# Patient Record
Sex: Female | Born: 1940 | Race: Black or African American | Hispanic: No | Smoking: Current every day smoker
Health system: Southern US, Community
[De-identification: ages and names within clinical notes are randomized; demographics above are authoritative.]

## PROBLEM LIST (undated history)

## (undated) DIAGNOSIS — M1990 Unspecified osteoarthritis, unspecified site: Secondary | ICD-10-CM

## (undated) DIAGNOSIS — I10 Essential (primary) hypertension: Secondary | ICD-10-CM

## (undated) HISTORY — PX: TOTAL HIP ARTHROPLASTY: SHX124

---

## 2005-04-02 ENCOUNTER — Other Ambulatory Visit: Admission: RE | Admit: 2005-04-02 | Discharge: 2005-04-02 | Payer: Self-pay | Admitting: Family Medicine

## 2005-04-25 ENCOUNTER — Inpatient Hospital Stay (HOSPITAL_COMMUNITY): Admission: RE | Admit: 2005-04-25 | Discharge: 2005-04-28 | Payer: Self-pay | Admitting: Orthopedic Surgery

## 2005-04-25 ENCOUNTER — Ambulatory Visit: Payer: Self-pay | Admitting: Physical Medicine & Rehabilitation

## 2005-04-28 ENCOUNTER — Inpatient Hospital Stay
Admission: RE | Admit: 2005-04-28 | Discharge: 2005-05-04 | Payer: Self-pay | Admitting: Physical Medicine & Rehabilitation

## 2005-08-08 ENCOUNTER — Ambulatory Visit (HOSPITAL_BASED_OUTPATIENT_CLINIC_OR_DEPARTMENT_OTHER): Admission: RE | Admit: 2005-08-08 | Discharge: 2005-08-08 | Payer: Self-pay | Admitting: Orthopedic Surgery

## 2005-08-08 ENCOUNTER — Ambulatory Visit (HOSPITAL_COMMUNITY): Admission: RE | Admit: 2005-08-08 | Discharge: 2005-08-08 | Payer: Self-pay | Admitting: Orthopedic Surgery

## 2007-09-03 ENCOUNTER — Inpatient Hospital Stay (HOSPITAL_COMMUNITY): Admission: RE | Admit: 2007-09-03 | Discharge: 2007-09-07 | Payer: Self-pay | Admitting: Orthopedic Surgery

## 2009-10-28 ENCOUNTER — Ambulatory Visit (HOSPITAL_COMMUNITY): Admission: RE | Admit: 2009-10-28 | Discharge: 2009-10-28 | Payer: Self-pay | Admitting: Orthopedic Surgery

## 2009-11-08 ENCOUNTER — Ambulatory Visit (HOSPITAL_COMMUNITY): Admission: RE | Admit: 2009-11-08 | Discharge: 2009-11-08 | Payer: Self-pay | Admitting: Orthopedic Surgery

## 2011-05-08 NOTE — Op Note (Signed)
NAMEGLORYA, Kara Montoya                ACCOUNT NO.:  [id number]   MEDICAL RECORD NO.:  [id number]          PATIENT TYPE:  INP   LOCATION:  2899                         FACILITY:  [facility]   PHYSICIAN:  Harvie Junior, M.D.   DATE OF BIRTH:  28-Jun-1941   DATE OF PROCEDURE:  09/03/2007  DATE OF DISCHARGE:                               OPERATIVE REPORT   PREOPERATIVE DIAGNOSES:  1. End stage degenerative joint disease, left hip.  2. Degenerative joint disease, right knee.   POSTOPERATIVE DIAGNOSES:  1. End stage degenerative joint disease, left hip.  2. Degenerative joint disease, right knee.   PRINCIPAL PROCEDURES:  1. Left total hip replacement with Laural Benes & [facility] S-ROM system, a      2015 stem, 3620D large cone, 36+8 stem with a 46+0 ball to go with      an ASR 52-mm cup.  2. Injection of right knee with 2 mL of 80 mg per mL Depo-Medrol.   SURGEON:  Harvie Junior, M.D.   ASSISTANT:  Marshia Ly, P.A.   ANESTHESIA:  General.   BRIEF HISTORY:  Ms. Loudin is a 70 year old female with a long history  of having end stage degenerative joint disease of bilateral hips.  We  have done her right hip.  She did great.  Because of fair conservative  care to her left hip, she came for this procedure.  She also has  significant degenerative joint disease of the right knee and we talked  about the need for an injection and she wanted to do this while she was  asleep and we planned on doing this preoperatively.   PROCEDURE:  The patient was brought to the operating room.  After  adequate anesthesia was obtained with general anesthetic, the patient  was placed up on the operating table.  The left knee was then injected  with 10 mL of 0.25% Marcaine with 2 mL of 80 mg per mL of Depo-Medrol.  This was done under sterile condition and a Band-Aid was placed.   She was then moved onto the right lateral decubitus position for the  left hip.  All bony prominences were well padded and a Foley  catheter  was then placed.  At this point, the left hip was prepped and draped in  the usual sterile fashion.  Following this, a curved incision was made  for a posterior approach to the hip.  Subcutaneous tissue was taken down  to the level of the tensor fascia which was divided in line with its  fibers and the gluteus medius and gluteus maximus with fluffs was finger  fractured.  A trial retractor was put in place.  The hip was then  internally rotated and couple retractors were placed superior to the  piriformis and also in the capsule.  The capsule was taken down  posteriorly in a flap.  The hip was dislocated and a provision neck cut  made.  The pattern was then exposed.  __________  was performed  circumferentially reaming to a level of 51 and a 52-mm ASR cup was  placed and  excellent left lower fixation laterally with an offset were  achieved.   At this point, attention was turned towards the stem where the old canal  finder was used, followed by sequential reaming up to a level of 15 and  select bone within the 15 reamer and a 15 __________  reamer was placed  down 3/4 of the way.  Following this, the cone was trialed to a 20D cone  and then a large __________  was reamed.  Following this, the 20D trial  was placed, 2015 stem and 36+8 __________  and trial reduction with  excellent range of motion and stability was achieved distally.  The  trial components were removed.  We copiously and thoroughly irrigated  the hip at this point with pulsatile lavage irrigation given that she  had a little bit of heterotopic bone on the other side.  Following this,  the final components were placed, a 20D large cone and spout followed by  a 2015 stem with a 36+8 offset.  This again was trialed after this was  put all the way down with a +0 ball.  Excellent range of motion and  stability were achieved and so this was the final chosen +0 ball at this  point.  The two external rotators and  posterior capsule were then  repaired over drill holes after one final irrigation and following this,  the tensor fascia was closed with 1 Vicryl running suture, skin with 0  and 2-0 Vicryl and skin staples.  A sterile compressive dressing was  applied and knee immobilizer.  The patient was taken to recovery, was  noted to be in satisfactory condition.   Estimated blood loss of the procedure was 250 mL.      Harvie Junior, M.D.  Electronically Signed     JLG/MEDQ  D:  09/03/2007  T:  09/03/2007  Job:  [id number]

## 2011-05-11 NOTE — Discharge Summary (Signed)
Kara Montoya, Kara Montoya                ACCOUNT NO.:  [id number]   MEDICAL RECORD NO.:  [id number]          PATIENT TYPE:  INP   LOCATION:  5012                         FACILITY:  [facility]   PHYSICIAN:  Harvie Junior, M.D.   DATE OF BIRTH:  05/11/1941   DATE OF ADMISSION:  04/25/2005  DATE OF DISCHARGE:  04/28/2005                                 DISCHARGE SUMMARY   ADMISSION DIAGNOSIS:  1.  Degenerative joint disease, right hip.  2.  Hypertension.  3.  Osteoarthritis.  4.  Tobacco abuse.   PROCEDURE:  Right total hip arthroplasty, Jodi Geralds, M.D., Apr 25, 2005.   CONSULTATIONS:  Physical medicine and rehabilitation, Ellwood Dense, M.D.   BRIEF HISTORY:  Kara Montoya is a pleasant 70 year old female who has a long  history of right hip pain.  She has pain in the right groin, she has pain at  night, and pain with ambulation.  X-rays of her right hip and pelvis show  end stage degenerative joint disease of the right hip.  She has stiffness of  her hip with pain with motion and difficulty with activities of daily living  due to her right hip osteoarthritis.  Based upon her clinical and  radiographic findings, she was felt to be a candidate for a right total hip  arthroplasty and she is admitted for this.   LABORATORY DATA:  Postoperative x-rays of the pelvis and right hip showed  acetabular and proximal femoral component seen.  The x-ray showed no  evidence of complications with good position of her prosthesis.  Hemoglobin  on admission was 14.8 with hematocrit 44.8.  Her potassium was 4.2.  CMP was  within normal limits.  Her INR was 1.  On postoperative day one, her  hemoglobin was 11.6 with  hematocrit 34, INR 1.1, potassium 4.6, sodium 140,  glucose 143.  On postop day two, the patient's hemoglobin was 11.3 with  hematocrit 33, INR 1.4.  On the day of discharge, her hemoglobin was 10.3  with an INR 1.5 on Coumadin therapy.   HOSPITAL COURSE:  The patient was taken to the operating  room and underwent  right total hip arthroplasty with an S-ROM prosthesis per Dr. Luiz Blare'  operative note.  Postoperatively, she was put on Coumadin per pharmacy  protocol for DVT prophylaxis, PCA morphine pump and IV 1 gram q.8h. x 5  doses.  Physical therapy saw the patient on postop day one and got her out  of bed to the chair.  On postop day one, she is overall doing well, she had  no complaints.  She had moderate hip pain.  Her blood pressure medication  was held as her blood pressure was 96/56.  She was seen by Dr. Thomasena Edis for a  rehab consult and was felt to be an excellent candidate for subacute care  unit transfer.  On postop day two, she was doing well, she stated she was  ready to get out of bed, she was taking fluids without difficulty, a Foley  catheter was placed at the time of surgery and was discontinued.  Her input  and output was good with good urine production.  She had a low grade fever  of 99.7, hemoglobin 11.3, INR 1.4.  Postoperative x-rays showed good  position of the right total hip prosthesis.  On postop day two, her IV was  converted to a saline lock, her Foley catheter was discontinued, and her PCA  morphine pump was discontinued.  On postop day three, the patient was doing  well, she was working with physical therapy and was felt to be an excellent  candidate for SACU transfer.  She was discharged to the subacute care unit  on postop day three.  Her INR was 1.5 at this point with a hemoglobin of  10.3.  Her right hip incision was clean and dry without signs of infection  and she had good neurovascular status in the right lower extremity with a  soft, nontender calf.   CONDITION ON DISCHARGE:  Improved.   DISCHARGE INSTRUCTIONS:  Diet regular.  Activity status will be weight-  bearing as tolerated on the right, ambulating with a walker.  We will see  her every few days while on the subacute floor.   DISCHARGE MEDICATIONS:  Her usual home medications, Atenolol  one daily with  the addition of Coumadin per pharmacy protocol and Percocet 5 mg 1-2 q.6h.  p.r.n. pain.      Marshia Ly, P.A.      Harvie Junior, M.D.  Electronically Signed    JB/MEDQ  D:  07/18/2005  T:  07/18/2005  Job:  [id number]

## 2011-05-11 NOTE — Discharge Summary (Signed)
Kara Montoya, Kara Montoya NO.:  [id number]   MEDICAL RECORD NO.:  [id number]          PATIENT TYPE:  ORB   LOCATION:  4530                         FACILITY:  [facility]   PHYSICIAN:  Ellwood Dense, M.D.   DATE OF BIRTH:  Feb 27, 1941   DATE OF ADMISSION:  04/28/2005  DATE OF DISCHARGE:  05/04/2005                                 DISCHARGE SUMMARY   DISCHARGE DIAGNOSES:  1.  Right total hip replacement.  2.  Postoperative anemia.  3.  Hypertension.   HISTORY OF PRESENT ILLNESS:  Kara Montoya is a 70 year old female with a  history of hypertension end-stage DJD of the right hip with pain and  decrease in ambulation.  She elected to undergo right total hip replacement  on Apr 25, 2005, by Dr. Luiz Blare.  Postoperatively is weightbearing as  tolerated and on Coumadin for DVT prophylaxis.  Therapy was initiated and  the patient is at minimal assistance for transfers, minimal assistance at  supervision to ambulate 10 feet, requiring verbal cues and is noted to have  a slow rate.  Pain control continues to be an issue with problems with lower  extremity spasms.  SACU was consulted for progressive therapies.   PAST MEDICAL HISTORY:  Significant for hypertension and obesity.   ALLERGIES:  No known drug allergies.   FAMILY HISTORY:  Positive for CVA.   SOCIAL HISTORY:  The patient lives alone.  Was working until March of 2006.  She smokes five to six cigarettes a day.  Does not use any alcohol.   HOSPITAL COURSE:  Kara Montoya was admitted to subacute on Apr 28, 2005,  for SACU level therapies to consist of PT and OT daily.  Post admission, she  was maintained on Coumadin for DVT prophylaxis.  She is noted to have right  lower extremity edema and problems with right calf and shin pain.  Bilateral  lower extremity Dopplers were done on Apr 30, 2005, showing no obvious DVT or  SVT.  Study was limited secondary to body habitus and edema.  Followup  laboratories done on Apr 30, 2005, showed the patient's postoperative anemia  to be stable with hemoglobin 10.3, hematocrit 29.7, white count 7.0 and  platelets 235.  Check of electrolytes revealed sodium 141, potassium 3.9,  chloride 106, CO2 29, BUN 11, creatinine 1.0 and glucose 121.  [name]  right hip incision was noted to be healing well without any signs or  symptoms of injection.  No erythema noted.  The patient was started on  OxyContin CR for more consistent pain relief with Flexeril for muscle  spasms.  The patient made good progress during her stay on subacute.  At the  time of discharge, she was modified independent for transfers and modified  independent for ambulating 200 feet with a rolling walker.  She was at  modified independent level for ADLs and modified independent level for  toileting.  Further followup therapies to include home health PT and OT by  [facility].  On May 04, 2005, the patient is discharged to home.  DISCHARGE MEDICATIONS:  1.  Coumadin 5 mg two p.o. q.p.m.  2.  Trinsicon one p.o. b.i.d.  3.  Tenormin 100 mg a day.  4.  OxyContin CR 10 mg one p.o. b.i.d. x 1 week and then one per day until      gone.  5.  Flexeril 5 mg t.i.d. p.r.n. spasm.  6.  Senokot-S two p.o. q.h.s.   ACTIVITY:  Use walker.  Follow right total hip precautions.   DIET:  Regular.   WOUND CARE:  Wash with soap and water.  Keep area clean and dry.   SPECIAL INSTRUCTIONS:  No alcohol.  No smoking.  No driving.  May use  Tylenol additionally for pain.  Dry dressing to right hip until drainage  resolves.  [facility] to provide PT, OT and R.N. with next pro time  to be drawn on May 07, 2005.  Coumadin to continue until May 26, 2005.   FOLLOWUP:  The patient is to follow up with Dr. Luiz Blare for an appointment in  two weeks.  Follow up with Dr. Idell Pickles for routine check.  Follow up with  Ellwood Dense, M.D., as needed.      PP/MEDQ  D:  05/04/2005  T:  05/06/2005  Job:  [id number]   cc:    Harvie Junior, M.D.  [address]  [place]  [place] [address]  Fax: [phone]   Philis Nettle, MD

## 2011-05-11 NOTE — Discharge Summary (Signed)
Kara Montoya, [name]                ACCOUNT NO.:  [id number]   MEDICAL RECORD NO.:  [id number]          PATIENT TYPE:  INP   LOCATION:  5018                         FACILITY:  [facility]   PHYSICIAN:  Harvie Junior, M.D.   DATE OF BIRTH:  Jul 11, 1941   DATE OF ADMISSION:  09/03/2007  DATE OF DISCHARGE:  09/07/2007                               DISCHARGE SUMMARY   ADMISSION DIAGNOSES:  1. End-stage degenerative joint disease, left hip.  2. Moderate hypertension.  3. Status post right total hip arthroplasty in 2006.  4. Degenerative arthritis, right knee.   DISCHARGE DIAGNOSES:  1. End-stage degenerative joint disease, left hip.  2. Moderate hypertension.  3. Status post right total hip arthroplasty in 2006.  4. Degenerative arthritis, right knee.  5. Postoperative nausea and vomiting.  6. Morbid obesity.   PROCEDURES IN THE HOSPITAL:  1. Left total hip arthroplasty using a SROM prosthesis,  Jodi Geralds,      MD, September 03, 2007.  2. Intra-articular cortisone injection, right knee.   BRIEF HISTORY:  Kara Montoya is a 70 year old female well known to [place]  who has a long his history of left groin and hip pain and stiffness with  limited range of motion.  X-rays of her pelvis and left hip showed end-  stage bone-on-bone degenerative arthritis.  She got no relief with  conservative treatment had pain with ambulation and pain at night. Based  upon her clinical and radiographic findings, she was admitted for a left  total hip arthroplasty.  She had the right one done by Dr. Luiz Blare in  2006 and did well from it and is interested in pursuing the left side.  She also has degenerative arthritis noted clinically and on radiographs  of the right knee,  and at the time of her hip surgery will get a  cortisone injection into her right knee joint.  She is admitted for  these procedures.   PERTINENT LABORATORY STUDIES:  Her EKG on admission showed normal sinus  rhythm with nonspecific T-wave  abnormality.  when compared with EKG of  August 07, 2005, ST changes were more marked.   Postoperative x-ray of the pelvis and left hip showed a left total hip  arthroplasty anatomically in alignment.   Hemoglobin on admission was 15.0, hematocrit 45.1.  On postop day #1,  hemoglobin was 12.1, day #2 was 11.5; and #3 was 10.8.  Pro time on  admission was 13.0 seconds with an INR of 1.0.  PTT was 28.  On the day  of discharge, her INR was 1.6 with a pro time of 19.1 seconds.  CMET on  admission showed no abnormalities other than slightly elevated glucose  at 119. She did have an elevated alkaline phosphatase of 136 with normal  being 39-117.  No other abnormalities noted.  Urinalysis showed no  abnormalities.   HOSPITAL COURSE:  The patient was brought to the hospital where she  underwent left total hip arthroplasty as well described in Dr. Luiz Blare'  operative note of September 03, 2007. Preoperatively, she was given a  gram of Ancef  and 80 mg IV of gentamicin.  She was given 1 gram Ancef  q.8 h x3 doses postop. PCA Dilaudid pump was used for pain control.  Postoperatively, she was on IV fluids.  Physical therapy was ordered for  walker ambulation, weightbearing as tolerated on the left.  Postoperative x-ray showed good position and alignment of the  prosthesis.   On postop day #1, she had complaints of being nauseated. Her Foley  catheter placed at time of surgery was intact.  She was taking p.o.  fluids without difficulty.  Her vital signs were stable.  She was  afebrile.  Hemoglobin was 12.1.  Her INR was 1.1.  We felt that the  nausea may be secondary to her IV Dilaudid, and this was discontinued.  She was started on OxyContin 30 mg b.i.d. and used Percocet for  breakthrough pain.  She was on subcutaneous Lovenox, converting over to  Coumadin for DVT prophylaxis.   On postop day #2, she stated she felt pretty good. She was taking fluids  and voiding without difficulty.  She got  out of bed to the chair.  Her  left hip dressing was clean and dry.  Hemoglobin was 11.5.  INR was 1.2.  Her knee immobilizer was discontinued, and her IV was converted to a  saline lock.   On postop day #3, she was doing well.  She was taking fluids and voiding  without difficulty.  She was progressing with physical therapy and was  independently ambulating with a walker.  Vital signs were stable.  She  was afebrile.  Her left hip dressing was clean and dry.  Her wound was  benign.  Her INR was 1.5.  Her hemoglobin was 10.8. The saline lock was  discontinued.  She was discharged home after being seen by physical  therapy.  She was given a prescription for Percocet 5 mg p.o. for pain  and Coumadin x1 month postop for DVT prophylaxis.   She was discharged to ambulate weightbearing as tolerated on the left  with a walker.  She will get her dressing changed to the left hip every  3 days.  She will need home health physical therapy and home health  Coumadin management x1 month postop.   She will follow up with Dr. Luiz Blare in the office in 10 days.  She will  be on a regular diet at time of discharge.      Kara Montoya, P.A.      Harvie Junior, M.D.  Electronically Signed    JB/MEDQ  D:  12/29/2007  T:  12/29/2007  Job:  [id number]   cc:   Sigmund Hazel, M.D.

## 2011-05-11 NOTE — Op Note (Signed)
NAME:  Kara Montoya, Kara Montoya                ACCOUNT NO.:  [id number]   MEDICAL RECORD NO.:  [id number]          PATIENT TYPE:  AMB   LOCATION:  DSC                          FACILITY:  [facility]   PHYSICIAN:  Harvie Junior, M.D.   DATE OF BIRTH:  Jul 06, 1941   DATE OF PROCEDURE:  08/08/2005  DATE OF DISCHARGE:                                 OPERATIVE REPORT   PREOPERATIVE DIAGNOSIS:  Painful right knee.   POSTOPERATIVE DIAGNOSES:  1.  Lateral meniscal tear.  2.  Chondromalacia, medial femoral condyle.   PROCEDURE:  1.  Partial posterior and lateral meniscectomy.  2.  Debridement of chondromalacia, medial femoral condyle.   SURGEON:  Harvie Junior, M.D.   ASSISTANT:  Marshia Ly, P.A.   ANESTHESIA:  General.   INDICATIONS FOR PROCEDURE:  [name] is a 70 year old female with a long  history of having significant right knee pain.  She has had a total hip done  three months ago, and was continuing to persist with right knee pain, so  this was really what was slowing her up.  Examination showed that she had a  medial joint line tenderness and pain with provocative maneuvers.  It was  ultimately felt that an arthroscopy would be the most appropriate course of  action, given that the knee was the rate-limiting factor. She is taken to  the operating room for this procedure.   DESCRIPTION OF PROCEDURE:  The patient is taken to the operating room.  After an adequate level of anesthesia was obtained with general anesthesia,  the patient was placed on the operating room table.  The right leg was  prepped and draped in the usual sterile fashion.  Following this a routine  arthroscopic examination of the knee revealed that there was an obvious  posterior horn lateral meniscal tear, which was debrided with the straight  biting forceps.  The remaining meniscal rim was contoured down with the  flexion shaver and a probe was used to make sure it was stable.  On the  medial side interestingly  posteromedially, I do not think there was a V-  style tear, but I think that this is where the meniscus came to an end.  We  debrided that.  The meniscus was certainly stable in that area.  It  certainly was not pulling into the knee at all.  We debrided back and sort  of blunted off this edge, so it would not be catching, and the medial  femoral condyle showed a little bit of an area of chondromalacia up in the  area where a medial plica may be.  We debrided the medial plica out as well,  as debriding the medial femoral condyle and following this the knee was  copiously irrigated and suctioned dry until the femoral joint was debrided  thoroughly.  There was no really significant chondromalacia of the  patellofemoral joint.  At this point the knee was again copiously irrigated  and suctioned dry.  The arthroscope portal was closed with a bandage.  A  sterile compressive dressing was applied.   The  patient was taken to the recovery room.  She was noted to be in  satisfactory condition.  The estimated blood loss for the procedure was  none.      Harvie Junior, M.D.  Electronically Signed     JLG/MEDQ  D:  08/08/2005  T:  08/08/2005  Job:  [id number]

## 2011-05-11 NOTE — Op Note (Signed)
NAME:  Kara Montoya, Kara Montoya                ACCOUNT NO.:  [id number]   MEDICAL RECORD NO.:  [id number]          PATIENT TYPE:  INP   LOCATION:  5012                         FACILITY:  [facility]   PHYSICIAN:  Harvie Junior, M.D.   DATE OF BIRTH:  08-06-1941   DATE OF PROCEDURE:  04/25/2005  DATE OF DISCHARGE:                                 OPERATIVE REPORT   PREOPERATIVE DIAGNOSIS:  End stage degenerative joint disease, right hip.   POSTOPERATIVE DIAGNOSIS:  End stage degenerative joint disease, right hip.   PROCEDURE:  Right total hip replacement with Laural Benes and Laural Benes S-ROM  prosthesis, a 20/15 stem with a 36 plus 0 neck and ball, 32 mm, 20D large  cone, a 54 mm socket with a 10 degree hooded polyethylene liner.   SURGEON:  Harvie Junior, M.D.   ASSISTANT:  Marshia Ly, P.A.   ANESTHESIA:  General.   BRIEF HISTORY:  She is a 70 year old female with a long history of having  significant end stage degenerative joint disease of her right hip.  She was  also evaluated in the office and noted to have this.  We discussed treatment  options and it was ultimately felt that total hip replacement was the most  appropriate course of action.  She considered her options carefully and  ultimately did present requesting total hip replacement, right side.  She is  brought to the operating room for this procedure.   DESCRIPTION OF PROCEDURE:  The patient was brought to the operating room and  after adequate anesthesia was obtained with general anesthetic, the patient  was placed on the operating table.  The right hip was prepped and draped in  a sterile fashion after the patient was rolled in the left lateral decubitus  position, all bony prominences had been well padded.  Attention was turned  to the lateral aspect of the hip where a long incision was made.  The  subcutaneous tissue were dissected down to the tensor fascia which was  divided in line with its fibers and a posterior approach to the  hip was  undertaken.  The hip was dislocated, provisional neck cut made, and  attention was turned to the acetabulum.   We sequentially reamed up to a level of 53, a 54 socket placed in 45 degrees  of lateral opening and 30 degrees of anteversion.  A polyethylene trial  liner was then used and attention was turned to the stem, sequentially  reamed.  We initially templated to 18/13 but it was clear that was going to  be a larger stem, we reamed her up to 15, I got good chatter there, and did  15/5 3/4 of the way down.  We turned to the stem then and did a 20D large  cone.  At this point, the trial was put in place with a 42 base neck as a  template, it was too long, offset was too much.  We changed this to a 36  base neck and this felt great, stability was excellent.  We did antevert 10  degrees to improve stability  and excellent stability was achieved at this  point.  At this point, the wound was copiously irrigated and suctioned dry.  The trial components were removed.  The apex hole eliminator was placed.  The final polyethylene was placed, 54 degree 10 degree liner in the  posterior superior position.  Attention was turned to the stem, a 20/15 stem  was placed through a 20D large cone and a 36 stem with a 32 mm ball.  At  this point, the hip was put through a range of motion and excellent  stability was achieved as well as symmetric leg length and good extension in  external rotation, we could flex her to 90 with internal rotation of about  60 degrees with no tendency towards dislocation.   At this point, the wound was copiously irrigated and suctioned dry.  The  capsule and short external rotators were repaired posteriorly through drill  holes in the trochanter.  The tensor fascia was closed with #1 Vicryl  running suture, the subcu with 0 and 2-0 Vicryl and skin with skin staples.  A sterile compressive dressing was applied as well as a knee immobilizer.  The patient was taken to  the recovery room where she was noted to be in  satisfactory condition.  Estimated blood loss was none.      JLG/MEDQ  D:  04/25/2005  T:  04/25/2005  Job:  [id number]

## 2011-10-05 LAB — PROTIME-INR
INR: 1.1
INR: 1.2
Prothrombin Time: 13
Prothrombin Time: 14.7
Prothrombin Time: 19.1 — ABNORMAL HIGH

## 2011-10-05 LAB — URINALYSIS, ROUTINE W REFLEX MICROSCOPIC
Bilirubin Urine: NEGATIVE
Ketones, ur: NEGATIVE
Protein, ur: NEGATIVE
Urobilinogen, UA: 0.2

## 2011-10-05 LAB — CBC
HCT: 31.5 — ABNORMAL LOW
HCT: 36.3
HCT: 45.1
Hemoglobin: 10.8 — ABNORMAL LOW
Hemoglobin: 12.1
Hemoglobin: 15
MCHC: 33.2
MCV: 90.8
MCV: 91.4
MCV: 91.6
MCV: 92.6
Platelets: 178
Platelets: 181
Platelets: 229
RBC: 3.47 — ABNORMAL LOW
RBC: 3.71 — ABNORMAL LOW
RBC: 3.93
RBC: 4.92
RDW: 14.7 — ABNORMAL HIGH
RDW: 14.9 — ABNORMAL HIGH
RDW: 15.1 — ABNORMAL HIGH
WBC: 10.4
WBC: 11 — ABNORMAL HIGH
WBC: 6.6

## 2011-10-05 LAB — DIFFERENTIAL
Basophils Absolute: 0
Basophils Relative: 1
Eosinophils Absolute: 0.4
Lymphs Abs: 2.5
Monocytes Absolute: 0.5
Neutrophils Relative %: 47

## 2011-10-05 LAB — BASIC METABOLIC PANEL
BUN: 10
Chloride: 104
GFR calc Af Amer: >60
GFR calc non Af Amer: >60

## 2011-10-05 LAB — COMPREHENSIVE METABOLIC PANEL
AST: 17
Alkaline Phosphatase: 136 — ABNORMAL HIGH
BUN: 16
Chloride: 107
Creatinine, Ser: 0.78
GFR calc non Af Amer: >60
Glucose, Bld: 119 — ABNORMAL HIGH
Total Protein: 7

## 2011-10-05 LAB — TYPE AND SCREEN: ABO/RH(D): O NEG

## 2011-10-14 ENCOUNTER — Emergency Department (INDEPENDENT_AMBULATORY_CARE_PROVIDER_SITE_OTHER): Payer: Medicare Other

## 2011-10-14 ENCOUNTER — Emergency Department (HOSPITAL_BASED_OUTPATIENT_CLINIC_OR_DEPARTMENT_OTHER)
Admission: EM | Admit: 2011-10-14 | Discharge: 2011-10-15 | Disposition: A | Discharge status: Nursing Home (Includes ICF) | Payer: Medicare Other | Source: Home / Self Care | Attending: Emergency Medicine | Admitting: Emergency Medicine

## 2011-10-14 ENCOUNTER — Encounter: Payer: Self-pay | Admitting: *Deleted

## 2011-10-14 DIAGNOSIS — K8020 Calculus of gallbladder without cholecystitis without obstruction: Secondary | ICD-10-CM

## 2011-10-14 DIAGNOSIS — K8050 Calculus of bile duct without cholangitis or cholecystitis without obstruction: Secondary | ICD-10-CM

## 2011-10-14 HISTORY — DX: Essential (primary) hypertension: I10

## 2011-10-14 LAB — DIFFERENTIAL
Eosinophils Absolute: 0.4 10*3/uL (ref 0.0–0.7)
Eosinophils Relative: 6 % — ABNORMAL HIGH (ref 0–5)
Lymphocytes Relative: 34 % (ref 12–46)
Lymphs Abs: 2.5 10*3/uL (ref 0.7–4.0)
Monocytes Relative: 10 % (ref 3–12)

## 2011-10-14 LAB — COMPREHENSIVE METABOLIC PANEL
ALT: 8 U/L (ref 0–35)
Alkaline Phosphatase: 114 U/L (ref 39–117)
BUN: 19 mg/dL (ref 6–23)
CO2: 26 mEq/L (ref 19–32)
GFR calc Af Amer: 84 mL/min — ABNORMAL LOW (ref >90)
GFR calc non Af Amer: 72 mL/min — ABNORMAL LOW (ref >90)
Glucose, Bld: 177 mg/dL — ABNORMAL HIGH (ref 70–99)
Potassium: 4.1 mEq/L (ref 3.5–5.1)
Sodium: 143 mEq/L (ref 135–145)
Total Protein: 7.4 g/dL (ref 6.0–8.3)

## 2011-10-14 LAB — BASIC METABOLIC PANEL
BUN: 19 mg/dL (ref 6–23)
CO2: 27 mEq/L (ref 19–32)
Calcium: 9.5 mg/dL (ref 8.4–10.5)
GFR calc non Af Amer: 72 mL/min — ABNORMAL LOW (ref >90)
Glucose, Bld: 177 mg/dL — ABNORMAL HIGH (ref 70–99)

## 2011-10-14 LAB — URINALYSIS, ROUTINE W REFLEX MICROSCOPIC
Bilirubin Urine: NEGATIVE
Glucose, UA: NEGATIVE mg/dL
Hgb urine dipstick: NEGATIVE
Protein, ur: NEGATIVE mg/dL
Specific Gravity, Urine: 1.023 (ref 1.005–1.030)
Urobilinogen, UA: 0.2 mg/dL (ref 0.0–1.0)

## 2011-10-14 LAB — CBC
HCT: 42.2 % (ref 36.0–46.0)
Hemoglobin: 13.9 g/dL (ref 12.0–15.0)
MCH: 30.4 pg (ref 26.0–34.0)
MCV: 92.3 fL (ref 78.0–100.0)
RBC: 4.57 MIL/uL (ref 3.87–5.11)
WBC: 7.2 10*3/uL (ref 4.0–10.5)

## 2011-10-14 MED ORDER — FENTANYL CITRATE 0.05 MG/ML IJ SOLN
100.0000 ug | INTRAMUSCULAR | Status: DC | PRN
  Administered 2011-10-14: 100 ug via INTRAVENOUS
  Filled 2011-10-14: qty 2

## 2011-10-14 MED ORDER — DIPHENHYDRAMINE HCL 50 MG/ML IJ SOLN
25.0000 mg | Freq: Once | INTRAMUSCULAR | Status: AC
  Administered 2011-10-14: 23:00:00 via INTRAVENOUS
  Filled 2011-10-14: qty 1

## 2011-10-14 MED ORDER — FENTANYL CITRATE 0.05 MG/ML IJ SOLN
50.0000 ug | Freq: Once | INTRAMUSCULAR | Status: AC
  Administered 2011-10-14: 50 ug via INTRAVENOUS
  Filled 2011-10-14: qty 2

## 2011-10-14 MED ORDER — ONDANSETRON HCL 4 MG/2ML IJ SOLN
4.0000 mg | Freq: Once | INTRAMUSCULAR | Status: AC
  Administered 2011-10-14: 4 mg via INTRAVENOUS

## 2011-10-14 MED ORDER — ONDANSETRON HCL 4 MG/2ML IJ SOLN
INTRAMUSCULAR | Status: AC
  Administered 2011-10-14: 4 mg via INTRAVENOUS
  Filled 2011-10-14: qty 2

## 2011-10-14 NOTE — ED Notes (Signed)
Pt briefly dropped her 02 sats placed on 2L [place]

## 2011-10-14 NOTE — ED Notes (Signed)
Pt began having severe sharp right sided upper abd pain this PM denies N/V /D

## 2011-10-15 ENCOUNTER — Other Ambulatory Visit (INDEPENDENT_AMBULATORY_CARE_PROVIDER_SITE_OTHER): Payer: Self-pay | Admitting: General Surgery

## 2011-10-15 ENCOUNTER — Emergency Department (HOSPITAL_COMMUNITY): Payer: Medicare Other

## 2011-10-15 ENCOUNTER — Inpatient Hospital Stay (HOSPITAL_COMMUNITY)
Admission: EM | Admit: 2011-10-15 | Discharge: 2011-10-16 | DRG: 419 | Disposition: A | Discharge status: Home / Self Care | Payer: Medicare Other | Attending: General Surgery | Admitting: General Surgery

## 2011-10-15 DIAGNOSIS — R197 Diarrhea, unspecified: Secondary | ICD-10-CM

## 2011-10-15 DIAGNOSIS — K8020 Calculus of gallbladder without cholecystitis without obstruction: Secondary | ICD-10-CM

## 2011-10-15 DIAGNOSIS — R1011 Right upper quadrant pain: Secondary | ICD-10-CM

## 2011-10-15 DIAGNOSIS — K8010 Calculus of gallbladder with chronic cholecystitis without obstruction: Secondary | ICD-10-CM

## 2011-10-15 DIAGNOSIS — E119 Type 2 diabetes mellitus without complications: Secondary | ICD-10-CM | POA: Diagnosis present

## 2011-10-15 DIAGNOSIS — M1990 Unspecified osteoarthritis, unspecified site: Secondary | ICD-10-CM | POA: Diagnosis present

## 2011-10-15 DIAGNOSIS — K8000 Calculus of gallbladder with acute cholecystitis without obstruction: Principal | ICD-10-CM | POA: Diagnosis present

## 2011-10-15 DIAGNOSIS — R112 Nausea with vomiting, unspecified: Secondary | ICD-10-CM

## 2011-10-15 DIAGNOSIS — I10 Essential (primary) hypertension: Secondary | ICD-10-CM | POA: Diagnosis present

## 2011-10-15 HISTORY — PX: CHOLECYSTECTOMY: SHX55

## 2011-10-15 LAB — GLUCOSE, CAPILLARY
Glucose-Capillary: 103 mg/dL — ABNORMAL HIGH (ref 70–99)
Glucose-Capillary: 133 mg/dL — ABNORMAL HIGH (ref 70–99)

## 2011-10-15 MED ORDER — FENTANYL CITRATE 0.05 MG/ML IJ SOLN
50.0000 ug | Freq: Once | INTRAMUSCULAR | Status: AC
  Administered 2011-10-15: 50 ug via INTRAVENOUS
  Filled 2011-10-15: qty 2

## 2011-10-15 MED ORDER — FENTANYL CITRATE 0.05 MG/ML IJ SOLN
50.0000 ug | Freq: Once | INTRAMUSCULAR | Status: AC
  Administered 2011-10-15: 50 ug via INTRAVENOUS

## 2011-10-15 MED ORDER — IOHEXOL 300 MG/ML  SOLN
100.0000 mL | Freq: Once | INTRAMUSCULAR | Status: AC | PRN
  Administered 2011-10-15: 100 mL via INTRAVENOUS

## 2011-10-15 NOTE — ED Notes (Signed)
I placed a call to the general surgery on call at [facility] long via the carelink line, dr. Abbey Chatters on call and returned the call @0105am .

## 2011-10-15 NOTE — ED Provider Notes (Signed)
History     CSN: [id number] Arrival date & time: 10/14/2011  9:44 PM   First MD Initiated Contact with Patient 10/14/11 2300      Chief Complaint  Patient presents with  . Abdominal Pain    (Consider location/radiation/quality/duration/timing/severity/associated sxs/prior treatment) Patient is a 70 y.o. female presenting with abdominal pain. The history is provided by the patient.  Abdominal Pain The primary symptoms of the illness include abdominal pain, nausea and vomiting. The primary symptoms of the illness do not include fever, shortness of breath or dysuria.  Symptoms associated with the illness do not include chills, constipation or back pain.   sharp in nature located in right upper quadrant started around 8 PM tonight and has been constant since onset. Patient did eat at the fair tonight had a sausage and heavy greasy meal. No history of gallbladder problems in the past. No measured fevers or chills. She does have associated nausea and vomiting but no diarrhea. No bloody or bilious emesis. No radiation of pain. Pain is moderate severe nature. No aggravating or alleviating factors.  Past Medical History  Diagnosis Date  . Diabetes mellitus   . Hypertension     Past Surgical History  Procedure Date  . Total hip arthroplasty     History reviewed. No pertinent family history.  History  Substance Use Topics  . Smoking status: Passive Smoker  . Smokeless tobacco: Not on file  . Alcohol Use: No    OB History    Grav Para Term Preterm Abortions TAB SAB Ect Mult Living                  Review of Systems  Constitutional: Negative for fever and chills.  HENT: Negative for neck pain and neck stiffness.   Eyes: Negative for pain.  Respiratory: Negative for shortness of breath.   Cardiovascular: Negative for chest pain.  Gastrointestinal: Positive for nausea, vomiting and abdominal pain. Negative for constipation and rectal pain.  Genitourinary: Negative for dysuria.    Musculoskeletal: Negative for back pain.  Skin: Negative for rash.  Neurological: Negative for headaches.  All other systems reviewed and are negative.    Allergies  Morphine and related  Home Medications   Current Outpatient Rx  Name Route Sig Dispense Refill  . LOSARTAN POTASSIUM 25 MG PO TABS Oral Take 25 mg by mouth daily.      Marland Kitchen METFORMIN HCL 500 MG PO TABS Oral Take 500 mg by mouth 2 (two) times daily with a meal.      . VITATAB MV 2.8 MG PO TABS Oral Take 2 tablets by mouth daily.        BP 147/76  Pulse 86  Temp(Src) 98.6 F (37 C) (Oral)  Resp 22  SpO2 97%  Physical Exam  Constitutional: She is oriented to person, place, and time. She appears well-developed and well-nourished.  HENT:  Head: Normocephalic and atraumatic.  Eyes: Conjunctivae and EOM are normal. Pupils are equal, round, and reactive to light.  Neck: Trachea normal. Neck supple. No thyromegaly present.  Cardiovascular: Normal rate, regular rhythm, S1 normal, S2 normal and normal pulses.     No systolic murmur is present   No diastolic murmur is present  Pulses:      Radial pulses are 2+ on the right side, and 2+ on the left side.  Pulmonary/Chest: Effort normal and breath sounds normal. She has no wheezes. She has no rhonchi. She has no rales. She exhibits no tenderness.  Abdominal:  Soft. Normal appearance and bowel sounds are normal. There is no CVA tenderness and negative Murphy's sign.       Tender to palpate right upper quadrant, negative Murphy's sign. No ecchymosis. No guarding or rebound tenderness.  Musculoskeletal:       BLE:s Calves nontender, no cords or erythema, negative Homans sign  Neurological: She is alert and oriented to person, place, and time. She has normal strength. No cranial nerve deficit or sensory deficit. GCS eye subscore is 4. GCS verbal subscore is 5. GCS motor subscore is 6.  Skin: Skin is warm and dry. No rash noted. She is not diaphoretic.  Psychiatric: Her speech is  normal.       Cooperative and appropriate    ED Course  Procedures (including critical care time)  Results for orders placed during the hospital encounter of 10/14/11  LIPASE, BLOOD      Component Value Range   Lipase 56  11 - 59 (U/L)  CBC      Component Value Range   WBC 7.2  4.0 - 10.5 (K/uL)   RBC 4.57  3.87 - 5.11 (MIL/uL)   Hemoglobin 13.9  12.0 - 15.0 (g/dL)   HCT 16.1  09.6 - 04.5 (%)   MCV 92.3  78.0 - 100.0 (fL)   MCH 30.4  26.0 - 34.0 (pg)   MCHC 32.9  30.0 - 36.0 (g/dL)   RDW 40.9  81.1 - 91.4 (%)   Platelets 234  150 - 400 (K/uL)  DIFFERENTIAL      Component Value Range   Neutrophils Relative 49  43 - 77 (%)   Neutro Abs 3.5  1.7 - 7.7 (K/uL)   Lymphocytes Relative 34  12 - 46 (%)   Lymphs Abs 2.5  0.7 - 4.0 (K/uL)   Monocytes Relative 10  3 - 12 (%)   Monocytes Absolute 0.7  0.1 - 1.0 (K/uL)   Eosinophils Relative 6 (*) 0 - 5 (%)   Eosinophils Absolute 0.4  0.0 - 0.7 (K/uL)   Basophils Relative 0  0 - 1 (%)   Basophils Absolute 0.0  0.0 - 0.1 (K/uL)  BASIC METABOLIC PANEL      Component Value Range   Sodium 143  135 - 145 (mEq/L)   Potassium 4.1  3.5 - 5.1 (mEq/L)   Chloride 108  96 - 112 (mEq/L)   CO2 27  19 - 32 (mEq/L)   Glucose, Bld 177 (*) 70 - 99 (mg/dL)   BUN 19  6 - 23 (mg/dL)   Creatinine, Ser 7.82  0.50 - 1.10 (mg/dL)   Calcium 9.5  8.4 - 95.6 (mg/dL)   GFR calc non Af Amer 72 (*) >90 (mL/min)   GFR calc Af Amer 84 (*) >90 (mL/min)  URINALYSIS, ROUTINE W REFLEX MICROSCOPIC      Component Value Range   Color, Urine YELLOW  YELLOW    Appearance CLEAR  CLEAR    Specific Gravity, Urine 1.023  1.005 - 1.030    pH 5.5  5.0 - 8.0    Glucose, UA NEGATIVE  NEGATIVE (mg/dL)   Hgb urine dipstick NEGATIVE  NEGATIVE    Bilirubin Urine NEGATIVE  NEGATIVE    Ketones, ur NEGATIVE  NEGATIVE (mg/dL)   Protein, ur NEGATIVE  NEGATIVE (mg/dL)   Urobilinogen, UA 0.2  0.0 - 1.0 (mg/dL)   Nitrite NEGATIVE  NEGATIVE    Leukocytes, UA NEGATIVE  NEGATIVE     COMPREHENSIVE METABOLIC PANEL  Component Value Range   Sodium 143  135 - 145 (mEq/L)   Potassium 4.1  3.5 - 5.1 (mEq/L)   Chloride 107  96 - 112 (mEq/L)   CO2 26  19 - 32 (mEq/L)   Glucose, Bld 177 (*) 70 - 99 (mg/dL)   BUN 19  6 - 23 (mg/dL)   Creatinine, Ser 8.75  0.50 - 1.10 (mg/dL)   Calcium 9.6  8.4 - 64.3 (mg/dL)   Total Protein 7.4  6.0 - 8.3 (g/dL)   Albumin 3.7  3.5 - 5.2 (g/dL)   AST 12  0 - 37 (U/L)   ALT 8  0 - 35 (U/L)   Alkaline Phosphatase 114  39 - 117 (U/L)   Total Bilirubin 0.2 (*) 0.3 - 1.2 (mg/dL)   GFR calc non Af Amer 72 (*) >90 (mL/min)   GFR calc Af Amer 84 (*) >90 (mL/min)   Ct Abdomen Pelvis W Contrast  10/15/2011  *RADIOLOGY REPORT*  Clinical Data: Right upper quadrant abdominal pain.  Nausea vomiting and diarrhea.  Diabetes.  CT ABDOMEN AND PELVIS WITH CONTRAST  Technique:  Multidetector CT imaging of the abdomen and pelvis was performed following the standard protocol during bolus administration of intravenous contrast.  Contrast: OMNIPAQUE IOHEXOL 300 MG/ML IV SOLN  Comparison: None.  Findings: Minimal subpleural nodularity in the anterior right upper lobe on image 6, likely a subpleural lymph node. Normal heart size without pericardial or pleural effusion.  Normal liver, spleen, stomach, pancreas.  A gallstone measures 1.8 cm,.  No definite evidence of acute cholecystitis.  Subtle pericholecystic edema on transverse image 58, coronal image 24 cannot be excluded.  No biliary ductal dilatation.  Normal adrenal glands.  Too small to characterize lesion in the left kidney.  Normal right kidney.  Atherosclerosis at the origin of bilateral renal arteries.  Minimal dilatation of the infrarenal aorta, maximally 2.3 cm. No retroperitoneal or retrocrural adenopathy.  Degraded evaluation of the pelvis, secondary to bilateral hip arthroplasties and resultant beam hardening artifact.  Colonic stool burden suggests constipation.  Normal terminal ileum and appendix.   Solid stool like material within distal small bowel loops, also suggesting constipation.  Fat containing left inguinal hernia.  No definite pelvic adenopathy.  Grossly normal urinary bladder and uterus, without definite adnexal mass or significant free pelvic fluid.  Tiny fat containing umbilical hernia.  Hip arthroplasty. No acute osseous abnormality.  IMPRESSION:  1.  Large gallstone.  Cannot exclude subtle pericholecystic edema. Consider correlation with ultrasound to exclude acute cholecystitis. 2.  Possible constipation. 3.  Degraded evaluation of the pelvis, secondary to beam hardening artifact from bilateral hip arthroplasty.  Original Report Authenticated By: Consuello Bossier, M.D.      Ct Abdomen Pelvis W Contrast  10/15/2011  *RADIOLOGY REPORT*  Clinical Data: Right upper quadrant abdominal pain.  Nausea vomiting and diarrhea.  Diabetes.  CT ABDOMEN AND PELVIS WITH CONTRAST  Technique:  Multidetector CT imaging of the abdomen and pelvis was performed following the standard protocol during bolus administration of intravenous contrast.  Contrast: OMNIPAQUE IOHEXOL 300 MG/ML IV SOLN  Comparison: None.  Findings: Minimal subpleural nodularity in the anterior right upper lobe on image 6, likely a subpleural lymph node. Normal heart size without pericardial or pleural effusion.  Normal liver, spleen, stomach, pancreas.  A gallstone measures 1.8 cm,.  No definite evidence of acute cholecystitis.  Subtle pericholecystic edema on transverse image 58, coronal image 24 cannot be excluded.  No biliary ductal dilatation.  Normal adrenal glands.  Too small to characterize lesion in the left kidney.  Normal right kidney.  Atherosclerosis at the origin of bilateral renal arteries.  Minimal dilatation of the infrarenal aorta, maximally 2.3 cm. No retroperitoneal or retrocrural adenopathy.  Degraded evaluation of the pelvis, secondary to bilateral hip arthroplasties and resultant beam hardening artifact.  Colonic  stool burden suggests constipation.  Normal terminal ileum and appendix.  Solid stool like material within distal small bowel loops, also suggesting constipation.  Fat containing left inguinal hernia.  No definite pelvic adenopathy.  Grossly normal urinary bladder and uterus, without definite adnexal mass or significant free pelvic fluid.  Tiny fat containing umbilical hernia.  Hip arthroplasty. No acute osseous abnormality.  IMPRESSION:  1.  Large gallstone.  Cannot exclude subtle pericholecystic edema. Consider correlation with ultrasound to exclude acute cholecystitis. 2.  Possible constipation. 3.  Degraded evaluation of the pelvis, secondary to beam hardening artifact from bilateral hip arthroplasty.  Original Report Authenticated By: Consuello Bossier, M.D.     At 1:10 AM the case was discussed as above with Dr. Abbey Chatters on call for general surgery. Since there is no ultrasound available at this facility at this time, he recommends transfer to [facility] long  emergency department for stat ultrasound and will be involved as needed. Case also discussed with Dr. Rubye Oaks who accepts patient in transfer to [facility] for ultrasound. Patient requiring multiple rounds of narcotics for pain control and Zofran for nausea. IV fluids provided.    MDM   Biliary colic with possible cholecystitis requiring ultrasound. Gallstone seen on CAT scan as above.         Sunnie Nielsen, MD 10/15/11 [phone]

## 2011-10-16 LAB — GLUCOSE, CAPILLARY: Glucose-Capillary: 113 mg/dL — ABNORMAL HIGH (ref 70–99)

## 2011-10-16 NOTE — H&P (Signed)
Kara Montoya, PATIENT NO.:  [id number]  MEDICAL RECORD NO.:  [id number]  LOCATION:  WLED                         FACILITY:  [facility]  PHYSICIAN:  Adolph Pollack, M.D.DATE OF BIRTH:  12-12-1941  DATE OF ADMISSION:  10/15/2011 DATE OF DISCHARGE:                             HISTORY & PHYSICAL   REASON FOR ADMISSION:  Acute cholecystitis.  HISTORY:  Kara Montoya is a 70 year old female [place] and [place] fries.  She then drove home.  When about 8 o'clock at night, had the sudden onset of right-sided abdominal pain. The pain radiated around her right flank area.  This was associated with nausea.  It was quite severe.  She initially presented to [facility] and was noted to have normal CBC and normal liver function test.  A CT scan demonstrated a large gallstone, but no obvious signs of acute cholecystitis.  Ultrasound was recommended to further evaluate the gallbladder.  She was treated with some pain medication, but still has had some residual discomfort.  She was transferred to [facility] Emergency Department.  She underwent an abdominal ultrasound.  This demonstrated multiple gallstones with thickened gallbladder wall and a common bile duct diameter, upper limits of normal at 8 mm.  CT was reviewed as well and this showed increased stool burden in the colon.  I was called to see her at that time.  She states she has had nausea and then vomited when she got to the hospital.  Denies fever or chills.  PAST MEDICAL HISTORY: 1. Hypertension. 2. Type 2 diabetes. 3. Degenerative joint disease.  PREVIOUS OPERATIONS:  Bilateral total hip replacements.  ALLERGIES:  MORPHINE.  MEDICATIONS:  Metformin, losartan, multivitamin.  SOCIAL HISTORY:  She does smoke cigarettes.  No alcohol use.  She is single and lives alone.  FAMILY HISTORY:  Positive for diabetes mellitus and hypertension.  REVIEW OF SYSTEMS:  CARDIAC:   She denies any heart disease.  PULMONARY: She denies asthma or pneumonia.  GI:  She denies hematemesis, peptic ulcer disease, hepatitis, diverticulitis.  GU:  She denies kidney stones or hematuria.  NEUROLOGIC:  No strokes or seizures.  HEMATOLOGIC:  No bleeding disorder or blood clots.  PHYSICAL EXAM:  GENERAL:  An obese female, no acute distress, is slightly sedated from her pain medicine, but pleasant and cooperative and answers questions appropriately. VITAL SIGNS:  Temperature is 98 degrees, heart rate 76, blood pressure is 113/62, respiratory rate 14, O2 saturations 95% on room air. HEENT:  Wears glasses.  No icterus.  Mucous membranes are dry. NECK:  Supple without masses. RESPIRATORY:  Breath sounds are equal and clear.  Respirations unlabored. CARDIOVASCULAR:  Demonstrates a regular rate, regular rhythm.  There is no murmur. ABDOMEN:  Soft with mild right upper quadrant tenderness.  No palpable masses or organomegaly.  MUSCULOSKELETAL:  Bilateral hip scars are noted.  There is no edema.  LABORATORY DATA:  CBC is within normal limits.  Electrolytes demonstrate a glucose of 177, otherwise within normal limits.  Liver function tests demonstrated a total bilirubin slightly low at 0.2, otherwise within normal limits.  Lipase within normal limits.  CT scan demonstrates a large gallstone and an increased stool burden in the colon.  Ultrasound demonstrates multiple gallstones with thickened gallbladder wall and a common bile duct of 8-mm in diameter.  IMPRESSION:  Acute cholecystitis in diabetic female.  PLAN:  Admit, start IV antibiotics and IV fluids.  I recommend a laparoscopic possible open cholecystectomy this admission.  I discussed the procedure and the risks with her.  Risks include, but not limited to bleeding, infection, wound healing problems, anesthesia, bile leak, injury of the common bile duct/liver/intestine, and post-cholecystectomy diarrhea.  She seems to  understand this and agrees with the plan.     Adolph Pollack, M.D.     Kari Baars  D:  10/15/2011  T:  10/15/2011  Job:  [id number]  cc:   Doreatha Martin, M.D.  Electronically Signed by Avel Peace M.D. on 10/16/2011 08:30:17 AM

## 2011-10-17 ENCOUNTER — Encounter (INDEPENDENT_AMBULATORY_CARE_PROVIDER_SITE_OTHER): Payer: Self-pay | Admitting: General Surgery

## 2011-10-17 ENCOUNTER — Telehealth (INDEPENDENT_AMBULATORY_CARE_PROVIDER_SITE_OTHER): Payer: Self-pay

## 2011-10-17 DIAGNOSIS — G8918 Other acute postprocedural pain: Secondary | ICD-10-CM

## 2011-10-17 MED ORDER — HYDROCODONE-ACETAMINOPHEN 5-325 MG PO TABS: 1.0000 | ORAL_TABLET | Freq: Four times a day (QID) | ORAL | Status: AC | PRN

## 2011-10-17 NOTE — Op Note (Signed)
NAMELEVETA, Kara Montoya NO.:  [id number]  MEDICAL RECORD NO.:  [id number]  LOCATION:  1540                         FACILITY:  [facility]  PHYSICIAN:  Juanetta Gosling, MDDATE OF BIRTH:  [birth year]  DATE OF PROCEDURE:  10/15/2011 DATE OF DISCHARGE:                              OPERATIVE REPORT   PREOPERATIVE DIAGNOSIS:  Acute cholecystitis.  POSTOPERATIVE DIAGNOSIS:  Acute cholecystitis.  PROCEDURE:  Laparoscopic cholecystectomy.  SURGEON:  Juanetta Gosling, MD  ASSISTANT:  Lodema Pilot, MD  ANESTHESIA:  General.  SPECIMENS:  Gallbladder on contents to pathology.  ESTIMATED BLOOD LOSS:  Minimal.  COMPLICATIONS:  None.  DRAINS:  None.  DISPOSITION:  Recovery in stable condition.  INDICATIONS:  This is a 70 year old female with several days of right upper quadrant pain that occurred after eating a fatty meal on Saturday. CT scan showed large gallstones and ultrasound was consistent with this as well with a thickened gallbladder wall.  Clinically, she also had gallbladder disease with an elevated white blood cell count and a right upper quadrant pain.  We discussed a laparoscopic cholecystectomy with her and risks and benefits associated with that.  PROCEDURE:  After informed consent was obtained, patient was taken to the operating room.  She was administered 3 g of intravenous Unasyn preoperatively.  She had sequential compression devices were placed on lower extremities prior to induction of anesthesia.  She was then placed under general anesthesia without complication.  Her abdomen was prepped and draped in a standard sterile surgical fashion.  Surgical time-out was then performed.  I infiltrated 0.25% Marcaine below her umbilicus.  I made a vertical incision.  I carried this down to her fascia.  Her umbilical stalk was grasped with a Kocher clamp.  I then incised her fascia sharply with an #11 blade.  I then entered her peritoneum bluntly.   There was no evidence of an entry injury.  I then used a 0 Vicryl suture and did a pursestring suture through a fascia.  A Hasson trocar was introduced and the abdomen was insufflated to 15 mmHg pressure.  I placed 2 additional trocars in the epigastrium and right upper quadrant after infiltration of local anesthetic under direct vision without complication.  Dr. Biagio Quint placed a final trocar in the right lateral abdominal wall.  We then retracted the gallbladder cephalad laterally.  We clearly had evidence of acute cholecystitis.  Eventually, I was able to dissect the triangle of Calot and obtained the critical view of safety.  I did not perform a cholangiogram due to her normal liver function tests preoperatively.  I then clipped the artery 3 times and divided.  I treated the duct in a similar fashion.  She had a very edematous plane and eventually, I was able to move her gallbladder from her liver bed without any difficulty.  This was then placed in an EndoCatch bag, and removed from the umbilicus.  Hemostasis was obtained.  I then irrigated until this was cleared.  I then removed the Hasson trocar and I viewed this from the epigastric port and this completely obliterated the defect when I tied down the pursestring suture.  The remainder of the  ports were then removed and the abdomen was desufflated.  The incisions were closed with 4-0 Monocryl and Dermabond was placed over.  She tolerated this well, was extubated transferred to the recovery room in stable condition.     Juanetta Gosling, MD     MCW/MEDQ  D:  10/15/2011  T:  10/16/2011  Job:  [id number]  Electronically Signed by Emelia Loron MD on 10/17/2011 03:43:48 PM

## 2011-10-17 NOTE — Telephone Encounter (Signed)
Patient called stating she has yet to have a BM for 3 day's also she had a 10 minute RUQ down to RLQ pain episode of pain after eating breakfast.  Patient not having nausea/vomiting, no abdominal swelling or bloating, fever or chills.  Advised patient to try Gas X for gas pain and milk of magnesia for her constipation also, to increase her fluid intake and continue with her low fat diet.  She states her pain medication didn't seem to be working as well Chief Financial Officer 5/325mg ) I ask the patient if she could take vicodin or hydrocodone due to allergy to morphine, patient would like to try it.  New RX called into [facility] @ [phone], Norco.hydrocodone 5/325mg , 1 po q4-6 hrs prn pain, #30, 0 refills.  Patient will call our office if her symptoms worsen or not improve.

## 2011-10-20 NOTE — Discharge Summary (Signed)
  Kara Montoya, Kara Montoya NO.:  [id number]  MEDICAL RECORD NO.:  [id number]  LOCATION:  1540                         FACILITY:  [facility]  PHYSICIAN:  Juanetta Gosling, MDDATE OF BIRTH:  1941/11/04  DATE OF ADMISSION:  10/15/2011 DATE OF DISCHARGE:  10/16/2011                              DISCHARGE SUMMARY   PROCEDURE:  Laparoscopic cholecystectomy, October 15, 2011.  ADMISSION DIAGNOSIS:  Acute Cholecystitis DISCHARGE DIAGNOSIS:  Same  ADDITIONAL DIAGNOSES: 1. Hypertension. 2. Diabetes. 3. Degenerative joint disease.  BRIEF HISTORY:  Patient is a 70 year old female with sudden onset of right-sided abdominal pain after eating sausage and [place] fries in a fair.  She went to [facility] where her CT showed large gallstones and ultrasound was obtained.  After that she was transferred to [facility] and she was seen in the emergency department in the early a.m. by Dr. Avel Peace.  To his opinion she had acute cholecystitis.  White count was 7200, hemoglobin 13.9, platelets are 234,000 electrolytes were normal.  BUN is 19, creatinine 0.8, glucose 177.  LFTs and lipase were normal.  HOSPITAL COURSE:  Patient was admitted.  She was placed on IV antibiotics and taken to the OR later that day by Dr. Dwain Sarna.  She tolerated the procedure well and returned to the floor in satisfactory condition.  She made good progress.  On her 1st postoperative day, she was mobilized, her diet was advanced, and after lunch she was ready for discharge.  We will plan to discharge her home on her preadmission medicines.  She was also given Percocet 1 to 2 p.o. q.4 h. p.r.n. for pain.  MEDICATIONS:  Home meds included: 1. Ibuprofen q.6 p.r.n. 2. Losartan 25 mg daily. 3. Metformin 500 mg p.o. b.i.d. She will follow up in our office in 2 to 3 weeks.  She has laparoscopic discharge instruction sheets with instructions to call for increased abdominal pain,  fever, trouble voiding, drainage, or pain in her abdominal site.  CONDITION ON DISCHARGE:  Improved.     Eber Hong, P.A.   ______________________________ Juanetta Gosling, MD    WDJ/MEDQ  D:  10/16/2011  T:  10/17/2011  Job:  [id number]  Electronically Signed by Sherrie George P.A. on 10/18/2011 08:52:11 PM Electronically Signed by Emelia Loron MD on 10/20/2011 11:35:31 AM

## 2011-10-29 ENCOUNTER — Telehealth (INDEPENDENT_AMBULATORY_CARE_PROVIDER_SITE_OTHER): Payer: Self-pay

## 2011-10-29 DIAGNOSIS — Z9049 Acquired absence of other specified parts of digestive tract: Secondary | ICD-10-CM

## 2011-10-29 MED ORDER — HYDROCODONE-ACETAMINOPHEN 5-325 MG PO TABS: 1.0000 | ORAL_TABLET | ORAL | Status: AC | PRN

## 2011-10-29 NOTE — Telephone Encounter (Signed)
Returned pt's voicemail message about making an appt. I did make an appt for the pt to see DR Dwain Sarna for the end of the month. The pt requested a refill on her Oxycodone but I advised the pt about our automatic rf for Hydrocodone. Pt asked if I would call the Rx into [facility]

## 2011-10-29 NOTE — Telephone Encounter (Signed)
Patient called to make her post op appointment for laparoscopic cholecystectomy, I transferred the patient to Alisha's voicemail.  I was unable to make her appointment and the patient is aware that we will call her back with appointment information.

## 2011-11-20 ENCOUNTER — Encounter (INDEPENDENT_AMBULATORY_CARE_PROVIDER_SITE_OTHER): Payer: Self-pay | Admitting: General Surgery

## 2011-11-20 ENCOUNTER — Ambulatory Visit (INDEPENDENT_AMBULATORY_CARE_PROVIDER_SITE_OTHER): Payer: Medicare Other | Admitting: General Surgery

## 2011-11-20 VITALS — BP 116/68 | HR 60 | Temp 96.9°F | Resp 16 | Ht 65.0 in | Wt 222.4 lb

## 2011-11-20 DIAGNOSIS — Z09 Encounter for follow-up examination after completed treatment for conditions other than malignant neoplasm: Secondary | ICD-10-CM

## 2011-11-20 LAB — LIPASE: Lipase: 28 U/L (ref 0–75)

## 2011-11-20 LAB — COMPREHENSIVE METABOLIC PANEL
ALT: 9 U/L (ref 0–35)
AST: 13 U/L (ref 0–37)
Alkaline Phosphatase: 119 U/L — ABNORMAL HIGH (ref 39–117)
Sodium: 142 mEq/L (ref 135–145)
Total Bilirubin: 0.4 mg/dL (ref 0.3–1.2)
Total Protein: 6.6 g/dL (ref 6.0–8.3)

## 2011-11-20 LAB — CBC
MCH: 30.7 pg (ref 26.0–34.0)
MCHC: 32 g/dL (ref 30.0–36.0)
MCV: 95.8 fL (ref 78.0–100.0)
Platelets: 224 10*3/uL (ref 150–400)
RDW: 15.1 % (ref 11.5–15.5)

## 2011-11-20 MED ORDER — OXYCODONE-ACETAMINOPHEN 5-325 MG PO TABS: 1.0000 | ORAL_TABLET | ORAL | Status: AC | PRN

## 2011-11-20 MED ORDER — ONDANSETRON HCL 4 MG PO TABS: 4.0000 mg | ORAL_TABLET | Freq: Three times a day (TID) | ORAL | Status: AC | PRN

## 2011-11-20 NOTE — Progress Notes (Signed)
Subjective:     Patient ID: Kara Montoya, female   DOB: 18-Nov-1941, 70 y.o.   MRN: [id number]  HPI 72 yof s/p lap chole on 10/22 for acute cholecystitis.  Pathology consistent with that.  She has had nausea when she eats and abdominal pain since surgery she states.  She is having bowel movements and is otherwise OK except for persistent nausea.  She reports no fevers.  It is related to eating.  Review of Systems     Objective:   Physical Exam Well healed incisions without infection, soft, nondistended, nontender    Assessment:     s/p lap chole, nausea   Plan:     I would expect her to feel better now.  I will plan on checking lfts, lipase and a ct to rule out ruq fluid collection.  If these are negative referral to gi for possible evaluation and egd will be next step.

## 2011-11-21 ENCOUNTER — Telehealth (INDEPENDENT_AMBULATORY_CARE_PROVIDER_SITE_OTHER): Payer: Self-pay

## 2011-11-21 ENCOUNTER — Encounter: Payer: Self-pay | Admitting: Gastroenterology

## 2011-11-21 ENCOUNTER — Ambulatory Visit
Admission: RE | Admit: 2011-11-21 | Discharge: 2011-11-21 | Disposition: A | Discharge status: Home / Self Care | Payer: Medicare Other | Source: Ambulatory Visit | Attending: General Surgery | Admitting: General Surgery

## 2011-11-21 DIAGNOSIS — R110 Nausea: Secondary | ICD-10-CM

## 2011-11-21 MED ORDER — IOHEXOL 300 MG/ML  SOLN
125.0000 mL | Freq: Once | INTRAMUSCULAR | Status: AC | PRN
  Administered 2011-11-21: 125 mL via INTRAVENOUS

## 2011-11-21 NOTE — Telephone Encounter (Signed)
Pt notified that all of her labs and xray were normal per Dr Dwain Sarna so now we need to refer her to see a GI doctor. The pt has never seen a GI doctor before so we will refer her to [facility]

## 2011-12-06 ENCOUNTER — Ambulatory Visit: Payer: Medicare Other | Admitting: Gastroenterology

## 2013-03-02 IMAGING — US US ABDOMEN COMPLETE
1 series · 13 of 25 positions shown · non-contrast
Comparison: CT of the abdomen and pelvis performed earlier today
at [DATE] a.m.

CLINICAL DATA: Abdominal pain; abdominal ultrasound suggested
based on the equivocal appearance of the gallbladder on CT.

ABDOMINAL ULTRASOUND COMPLETE

[Series 1: us abdomen complete · 0.34mm/px · 13 of 45 slices shown]
[im 1/45]
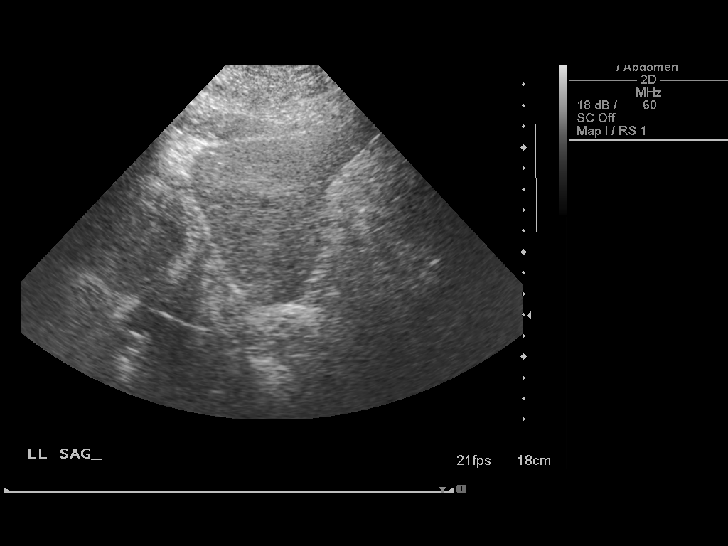
[im 4/45]
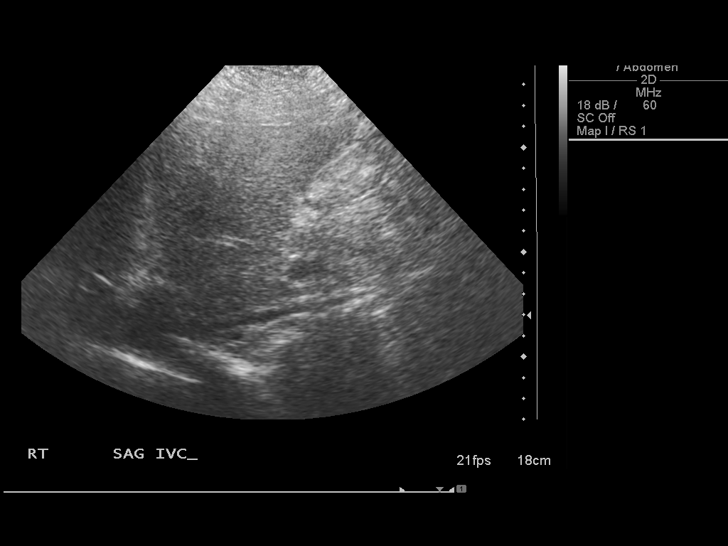
[im 8/45]
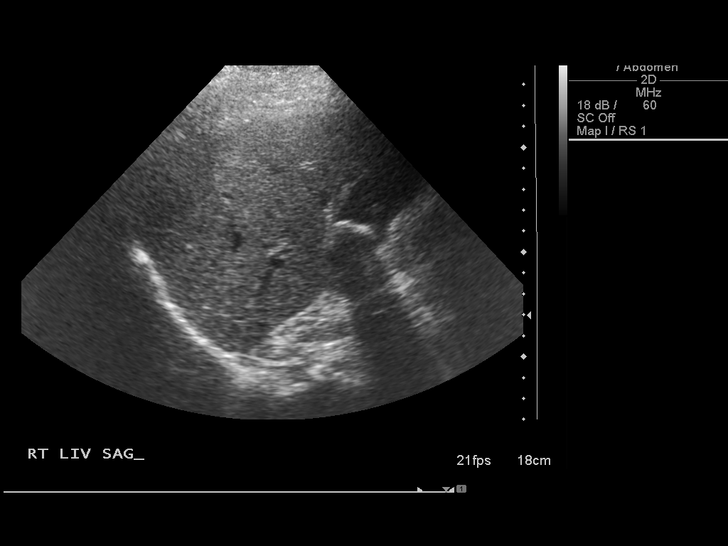
[im 12/45]
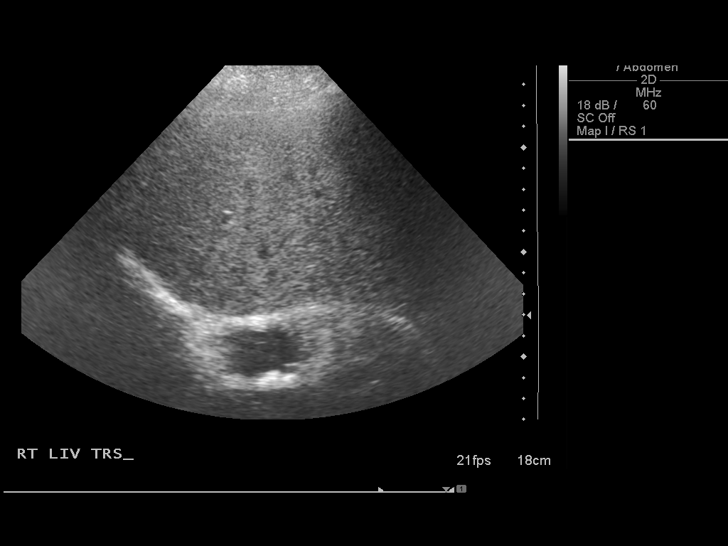
[im 15/45]
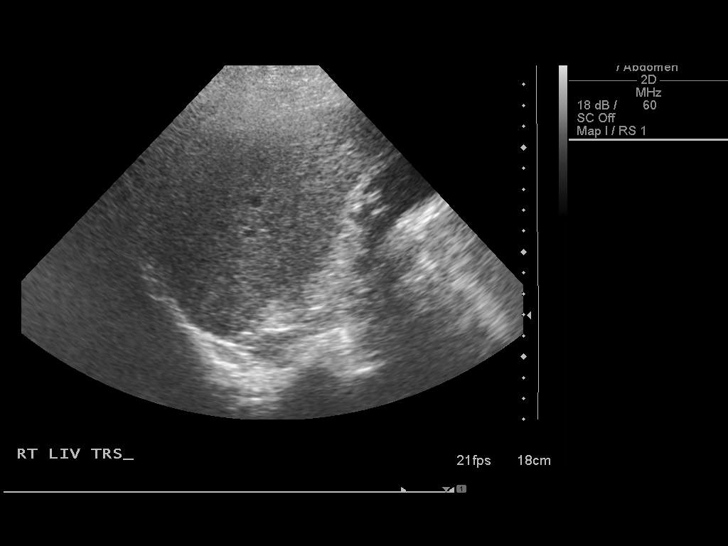
[im 19/45]
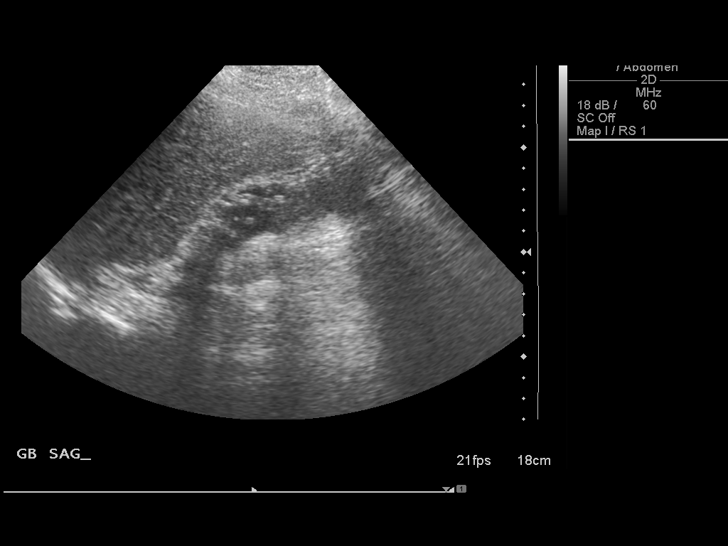
[im 23/45]
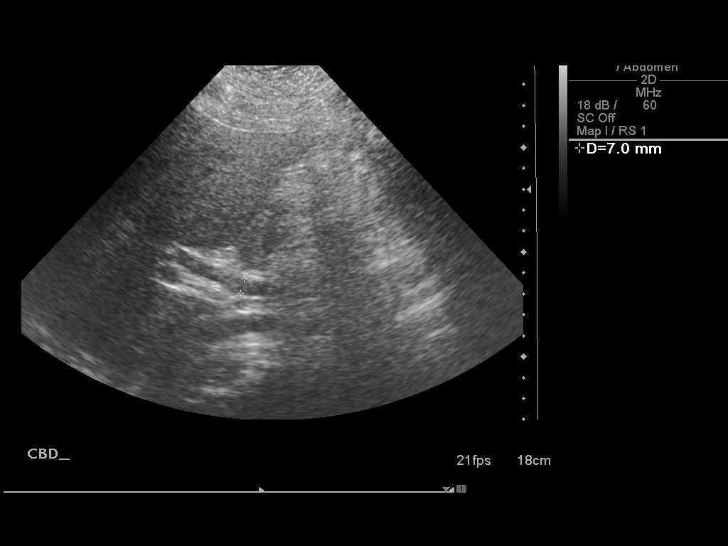
[im 26/45]
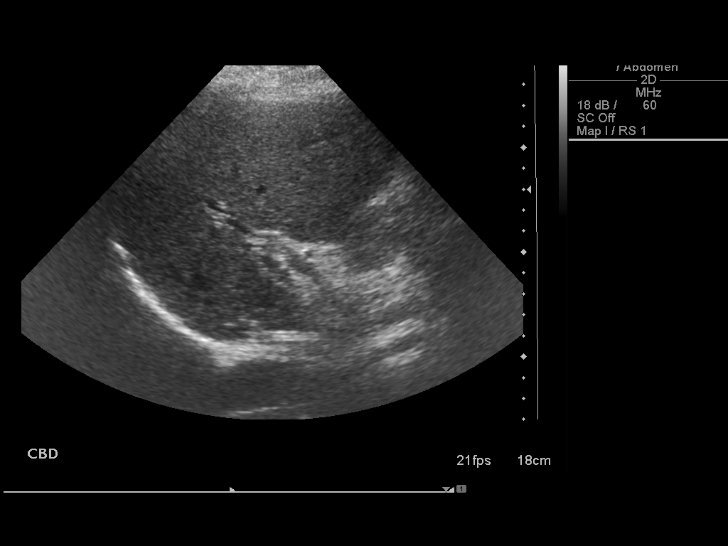
[im 30/45]
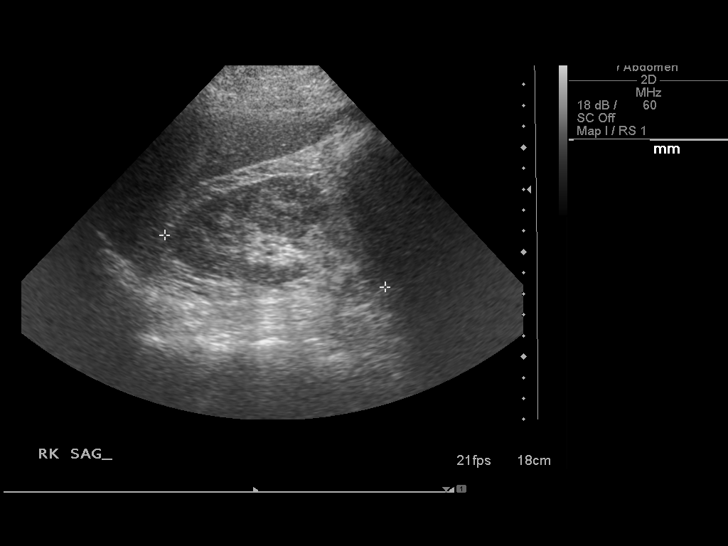
[im 34/45]
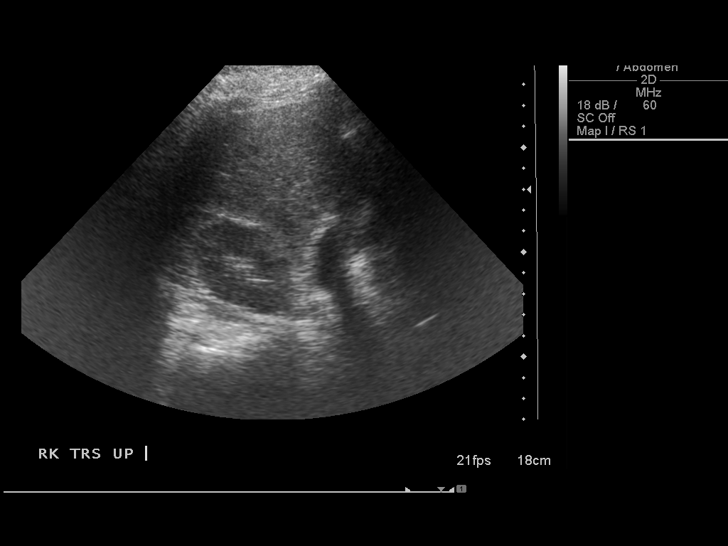
[im 37/45]
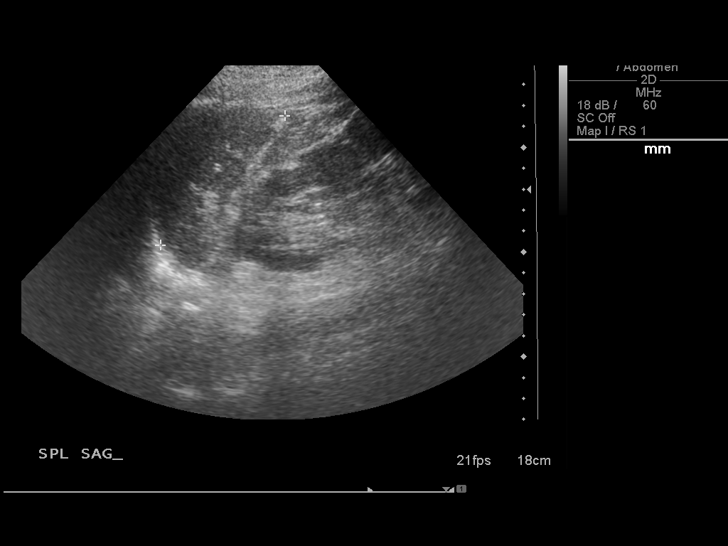
[im 41/45]
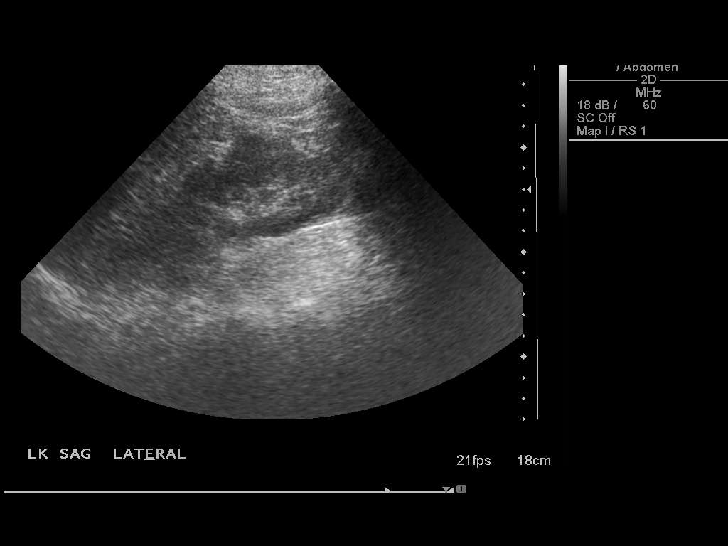
[im 45/45]
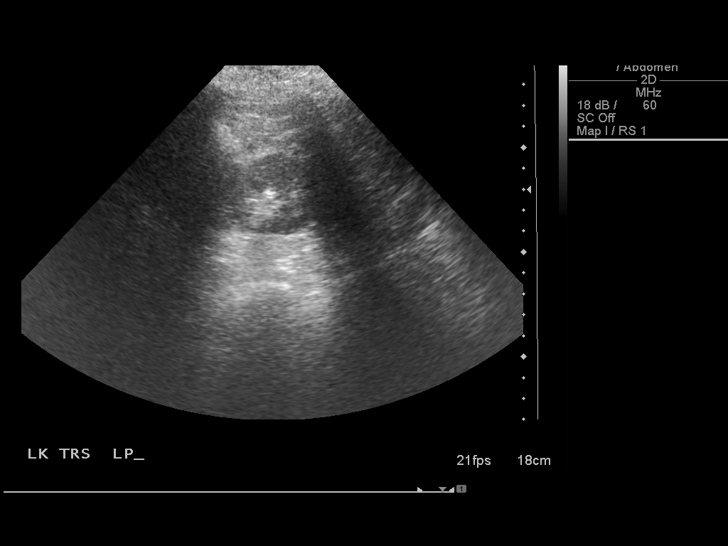

[13 of 25 positions shown; findings below may reference images not displayed]

FINDINGS: Gallbladder: Scattered small stones are seen layering dependently
within the gallbladder; the largest stone measures 2.1 cm, at the
base of the gallbladder.  There is diffuse edematous gallbladder
wall thickening to 0.7 cm, and an ultrasonographic Murphy's sign is
elicited, compatible with acute cholecystitis.

Common Bile Duct:  0.8 cm in diameter; borderline prominent for the
patient's age.  No intrahepatic biliary ductal dilatation is seen.

Liver:  Normal parenchymal echogenicity and echotexture; no focal
lesions identified.  Limited Doppler evaluation demonstrates normal
blood flow within the liver.

IVC:  Unremarkable in appearance.

Pancreas:  Although the pancreas is difficult to visualize due to
overlying bowel gas, no focal pancreatic abnormality is identified.

Spleen:  8.6 cm in length; within normal limits in size and
echotexture.

Right kidney:  12.2 cm in length; normal in size, configuration and
parenchymal echogenicity.  No evidence of mass or hydronephrosis.

Left kidney:  12.2 cm in length; normal in size, configuration and
parenchymal echogenicity.  No evidence of mass or hydronephrosis.

Abdominal Aorta:  Normal in caliber; no aneurysm identified.  Not
fully characterized due to overlying bowel gas.
IMPRESSION: Edematous gallbladder wall thickening to 0.7 cm, with scattered
gallstones measuring up to 2.1 cm in size, and a positive
ultrasonographic Murphy's sign, compatible with acute
cholecystitis.  Borderline prominence of the common hepatic duct,
without evidence of intrahepatic biliary ductal dilatation.

## 2013-08-26 ENCOUNTER — Encounter (HOSPITAL_BASED_OUTPATIENT_CLINIC_OR_DEPARTMENT_OTHER): Payer: Self-pay | Admitting: *Deleted

## 2013-08-26 ENCOUNTER — Emergency Department (HOSPITAL_BASED_OUTPATIENT_CLINIC_OR_DEPARTMENT_OTHER)
Admission: EM | Admit: 2013-08-26 | Discharge: 2013-08-27 | Disposition: A | Discharge status: Home / Self Care | Payer: Medicare Other | Attending: Emergency Medicine | Admitting: Emergency Medicine

## 2013-08-26 DIAGNOSIS — M5380 Other specified dorsopathies, site unspecified: Secondary | ICD-10-CM | POA: Insufficient documentation

## 2013-08-26 DIAGNOSIS — Z79899 Other long term (current) drug therapy: Secondary | ICD-10-CM | POA: Insufficient documentation

## 2013-08-26 DIAGNOSIS — R269 Unspecified abnormalities of gait and mobility: Secondary | ICD-10-CM | POA: Insufficient documentation

## 2013-08-26 DIAGNOSIS — M549 Dorsalgia, unspecified: Secondary | ICD-10-CM | POA: Insufficient documentation

## 2013-08-26 DIAGNOSIS — IMO0001 Reserved for inherently not codable concepts without codable children: Secondary | ICD-10-CM | POA: Insufficient documentation

## 2013-08-26 DIAGNOSIS — E119 Type 2 diabetes mellitus without complications: Secondary | ICD-10-CM | POA: Insufficient documentation

## 2013-08-26 DIAGNOSIS — M62830 Muscle spasm of back: Secondary | ICD-10-CM

## 2013-08-26 DIAGNOSIS — I10 Essential (primary) hypertension: Secondary | ICD-10-CM | POA: Insufficient documentation

## 2013-08-26 NOTE — ED Notes (Signed)
Pt c/o right flank and lower back pain since Tuesday. Pt denies any dysuria or odor.

## 2013-08-27 LAB — URINALYSIS, ROUTINE W REFLEX MICROSCOPIC
Glucose, UA: NEGATIVE mg/dL
Hgb urine dipstick: NEGATIVE
Ketones, ur: NEGATIVE mg/dL
Protein, ur: NEGATIVE mg/dL

## 2013-08-27 MED ORDER — METHOCARBAMOL 500 MG PO TABS: 500.0000 mg | ORAL_TABLET | Freq: Two times a day (BID) | ORAL | Status: DC

## 2013-08-27 MED ORDER — METHOCARBAMOL 500 MG PO TABS
1000.0000 mg | ORAL_TABLET | Freq: Once | ORAL | Status: AC
  Administered 2013-08-27: 1000 mg via ORAL
  Filled 2013-08-27: qty 2

## 2013-08-27 MED ORDER — HYDROCODONE-ACETAMINOPHEN 5-325 MG PO TABS
2.0000 | ORAL_TABLET | Freq: Once | ORAL | Status: AC
  Administered 2013-08-27: 2 via ORAL
  Filled 2013-08-27: qty 2

## 2013-08-27 MED ORDER — HYDROCODONE-ACETAMINOPHEN 5-325 MG PO TABS: 2.0000 | ORAL_TABLET | ORAL | Status: DC | PRN

## 2013-08-27 NOTE — ED Notes (Signed)
MD at bedside. 

## 2013-08-27 NOTE — ED Provider Notes (Signed)
CSN: [id number]     Arrival date & time 08/26/13  2343 History   None    Chief Complaint  Patient presents with  . Flank Pain   HPI   4-5 days of right lumbar pain. She had arthroscopy of her right knee didn't in July. She states she has had a limp before and after the surgery although her knee is doing better. She states she developed some pain in her left hip. She saw her orthopedist in 5 days ago. She had a "cortisone shot". In her right knee, and in her left hip. Both of these are doing better. When she walks the right side of her back is uncomfortable. She was sitting and watching a tennis match tonight at home. When she went to get up from a seated position she has worsening of pain. It does not radiate. She has no leg pain other than the knee. No radicular pattern. No bowel or bladder incontinence. No intra-abdominal or true flank discomfort. No fevers good appetite no additional symptoms Past Medical History  Diagnosis Date  . Diabetes mellitus   . Hypertension    Past Surgical History  Procedure Laterality Date  . Total hip arthroplasty    . Cholecystectomy  10/15/11   No family history on file. History  Substance Use Topics  . Smoking status: Passive Smoke Exposure - Never Smoker  . Smokeless tobacco: Never Used  . Alcohol Use: No   OB History   Grav Para Term Preterm Abortions TAB SAB Ect Mult Living                 Review of Systems  Constitutional: Negative for fever, chills, diaphoresis, appetite change and fatigue.  HENT: Negative for sore throat, mouth sores and trouble swallowing.   Eyes: Negative for visual disturbance.  Respiratory: Negative for cough, chest tightness, shortness of breath and wheezing.   Cardiovascular: Negative for chest pain.  Gastrointestinal: Negative for nausea, vomiting, abdominal pain, diarrhea and abdominal distention.  Endocrine: Negative for polydipsia, polyphagia and polyuria.  Genitourinary: Negative for dysuria, frequency and  hematuria.  Musculoskeletal: Positive for myalgias, back pain, arthralgias and gait problem. Negative for joint swelling.  Skin: Negative for color change, pallor and rash.  Neurological: Negative for dizziness, syncope, light-headedness and headaches.    Allergies  Morphine and related  Home Medications   Current Outpatient Rx  Name  Route  Sig  Dispense  Refill  . HYDROcodone-acetaminophen (NORCO/VICODIN) 5-325 MG per tablet   Oral   Take 2 tablets by mouth every 4 (four) hours as needed for pain.   10 tablet   0   . losartan (COZAAR) 25 MG tablet   Oral   Take 25 mg by mouth daily.           . metFORMIN (GLUCOPHAGE) 500 MG tablet   Oral   Take 500 mg by mouth 2 (two) times daily with a meal.           . methocarbamol (ROBAXIN) 500 MG tablet   Oral   Take 1 tablet (500 mg total) by mouth 2 (two) times daily.   20 tablet   0   . Multiple Vitamins-Minerals-FA (VITATAB MV) 2.8 MG TABS   Oral   Take 2 tablets by mouth daily.           Marland Kitchen omeprazole (PRILOSEC) 20 MG capsule               . ONE TOUCH ULTRA TEST test  strip               . ONETOUCH DELICA LANCETS MISC                BP 145/70  Pulse 71  Temp(Src) 97.9 F (36.6 C) (Oral)  Resp 18  Wt 220 lb (99.791 kg)  BMI 36.61 kg/m2  SpO2 100% Physical Exam  Constitutional:  Pleasant no acute distress  Cardiovascular:  Regular rhythm  Pulmonary/Chest:  Clear lungs no pleuritic pain  Abdominal:  Entry abdomen soft and benign no pain in the subcostal abdomen flank or right lower quadrant normal active bowel sounds  Musculoskeletal:  Walks with a limp to the right leg. No foot drop. Effusion the right knee. Left hip examines normally no erythema to the skin of the left hip, or right knee. Palpation in the right paraspinal musculature  Neurological:  Normal neurological exam the bilateral lower extremities with normal strength and sensation  Skin:  No rash vesicles or zoster    ED Course   Procedures (including critical care time) Labs Review Labs Reviewed  URINALYSIS, ROUTINE W REFLEX MICROSCOPIC   Imaging Review No results found.  MDM   1. Lumbar paraspinal muscle spasm    Reproducible paraspinal pain. Likely exacerbated due to her antalgic gait from her knee and hip. Plan Robaxin and Vicodin followup with her orthopedist continue anti-inflammatories at home. Oh symptoms are finding that would suggest radiculopathy. No abdominal complaints or findings to suggest an alternate referring etiology for her pain.    Claudean Kinds, MD 08/27/13 [phone]

## 2014-03-14 ENCOUNTER — Emergency Department (HOSPITAL_BASED_OUTPATIENT_CLINIC_OR_DEPARTMENT_OTHER)
Admission: EM | Admit: 2014-03-14 | Discharge: 2014-03-14 | Disposition: A | Discharge status: Home / Self Care | Payer: Medicare Other | Attending: Emergency Medicine | Admitting: Emergency Medicine

## 2014-03-14 ENCOUNTER — Emergency Department (HOSPITAL_BASED_OUTPATIENT_CLINIC_OR_DEPARTMENT_OTHER): Payer: Medicare Other

## 2014-03-14 ENCOUNTER — Encounter (HOSPITAL_BASED_OUTPATIENT_CLINIC_OR_DEPARTMENT_OTHER): Payer: Self-pay | Admitting: Emergency Medicine

## 2014-03-14 DIAGNOSIS — Z96649 Presence of unspecified artificial hip joint: Secondary | ICD-10-CM | POA: Insufficient documentation

## 2014-03-14 DIAGNOSIS — M25559 Pain in unspecified hip: Secondary | ICD-10-CM | POA: Insufficient documentation

## 2014-03-14 DIAGNOSIS — M25552 Pain in left hip: Secondary | ICD-10-CM

## 2014-03-14 DIAGNOSIS — M1990 Unspecified osteoarthritis, unspecified site: Secondary | ICD-10-CM | POA: Insufficient documentation

## 2014-03-14 DIAGNOSIS — Z79899 Other long term (current) drug therapy: Secondary | ICD-10-CM | POA: Insufficient documentation

## 2014-03-14 DIAGNOSIS — I10 Essential (primary) hypertension: Secondary | ICD-10-CM | POA: Insufficient documentation

## 2014-03-14 DIAGNOSIS — E119 Type 2 diabetes mellitus without complications: Secondary | ICD-10-CM | POA: Insufficient documentation

## 2014-03-14 MED ORDER — HYDROCODONE-ACETAMINOPHEN 5-325 MG PO TABS
2.0000 | ORAL_TABLET | Freq: Once | ORAL | Status: AC
  Administered 2014-03-14: 2 via ORAL
  Filled 2014-03-14: qty 2

## 2014-03-14 MED ORDER — HYDROCODONE-ACETAMINOPHEN 5-325 MG PO TABS: 1.0000 | ORAL_TABLET | ORAL | Status: DC | PRN

## 2014-03-14 NOTE — Discharge Instructions (Signed)
See your bone surgeon if pain does not improve or you develop fevers or new concerns.  If you were given medicines take as directed.  If you are on coumadin or contraceptives realize their levels and effectiveness is altered by many different medicines.  If you have any reaction (rash, tongues swelling, other) to the medicines stop taking and see a physician.   Please follow up as directed and return to the ER or see a physician for new or worsening symptoms.  Thank you. Filed Vitals:   03/14/14 1115  BP: 136/71  Pulse: 66  Temp: 98.1 F (36.7 C)  TempSrc: Oral  Resp: 18  SpO2: 100%

## 2014-03-14 NOTE — ED Provider Notes (Signed)
CSN: [id number]     Arrival date & time 03/14/14  1106 History   First MD Initiated Contact with Patient 03/14/14 1116     Chief Complaint  Patient presents with  . Hip Pain     (Consider location/radiation/quality/duration/timing/severity/associated sxs/prior Treatment) HPI Comments: 73 yo female with DM controlled, left hip replacement hx presents with left hip pain worse with movement since last night, took ibuprofen with mild relief.  Pt has osteoarthritis hx.  No injury.  No radiation down leg.  No cool or numbness feeling in leg.    Patient is a 73 y.o. female presenting with hip pain. The history is provided by the patient.  Hip Pain This is a new problem. Pertinent negatives include no chest pain, no abdominal pain and no headaches.    Past Medical History  Diagnosis Date  . Diabetes mellitus   . Hypertension    Past Surgical History  Procedure Laterality Date  . Total hip arthroplasty    . Cholecystectomy  10/15/11   No family history on file. History  Substance Use Topics  . Smoking status: Passive Smoke Exposure - Never Smoker  . Smokeless tobacco: Never Used  . Alcohol Use: No   OB History   Grav Para Term Preterm Abortions TAB SAB Ect Mult Living                 Review of Systems  Constitutional: Negative for fever and chills.  Cardiovascular: Negative for chest pain.  Gastrointestinal: Negative for vomiting and abdominal pain.  Genitourinary: Negative for flank pain.  Musculoskeletal: Negative for back pain.  Skin: Negative for rash.  Neurological: Negative for headaches.      Allergies  Morphine and related  Home Medications   Current Outpatient Rx  Name  Route  Sig  Dispense  Refill  . HYDROcodone-acetaminophen (NORCO/VICODIN) 5-325 MG per tablet   Oral   Take 2 tablets by mouth every 4 (four) hours as needed for pain.   10 tablet   0   . losartan (COZAAR) 25 MG tablet   Oral   Take 25 mg by mouth daily.           . metFORMIN  (GLUCOPHAGE) 500 MG tablet   Oral   Take 500 mg by mouth 2 (two) times daily with a meal.           . methocarbamol (ROBAXIN) 500 MG tablet   Oral   Take 1 tablet (500 mg total) by mouth 2 (two) times daily.   20 tablet   0   . Multiple Vitamins-Minerals-FA (VITATAB MV) 2.8 MG TABS   Oral   Take 2 tablets by mouth daily.           Marland Kitchen omeprazole (PRILOSEC) 20 MG capsule               . ONE TOUCH ULTRA TEST test strip               . ONETOUCH DELICA LANCETS MISC                BP 136/71  Pulse 66  Temp(Src) 98.1 F (36.7 C) (Oral)  Resp 18  SpO2 100% Physical Exam  Nursing note and vitals reviewed. Constitutional: She is oriented to person, place, and time. She appears well-developed and well-nourished.  HENT:  Head: Normocephalic and atraumatic.  Eyes: Conjunctivae are normal. Right eye exhibits no discharge. Left eye exhibits no discharge.  Neck: Normal range of motion. Neck  supple. No tracheal deviation present.  Cardiovascular: Normal rate.   Pulmonary/Chest: Effort normal.  Abdominal: Soft. There is no tenderness.  Musculoskeletal: She exhibits tenderness. She exhibits no edema.  Equal leg length, pain with ext rotation of left hip, nv intact left leg, tender to palpation posterior to left hip/ buttocks, no edema or swelling or left hip/ knee or ankle. No warmth  Neurological: She is alert and oriented to person, place, and time. GCS eye subscore is 4. GCS verbal subscore is 5. GCS motor subscore is 6.  5+ strength in UE and LE with f/e at major joints. Sensation to palpation intact in UE and LE.   Skin: Skin is warm. No rash noted.  Psychiatric: She has a normal mood and affect.    ED Course  Procedures (including critical care time) Labs Review Labs Reviewed - No data to display Imaging Review Dg Hip Complete Left  03/14/2014   CLINICAL DATA:  Left hip pain status post left hip replacement  EXAM: LEFT HIP - COMPLETE 2+ VIEW  COMPARISON:  CT  abdomen pelvis dated 11/21/2011  FINDINGS: Left hip arthroplasty, without evidence of hardware complication.  Right hip arthroplasty, without evidence of hardware complication.  No fracture or dislocation is seen. Visualized bony pelvis appears intact.  Degenerative changes of the lower lumbar spine.  11 mm linear radiodensity overlying the right iliac crest is in uncertain location.  IMPRESSION: Bilateral hip arthroplasties, without evidence of complication.  No fracture or dislocation is seen.  11 mm linear radiodensity overlying the right iliac crest, in an uncertain location. Correlate for radiopaque foreign body.   Electronically Signed   By: Charline BillsSriyesh  Krishnan M.D.   On: 03/14/2014 12:36     EKG Interpretation None      MDM   Final diagnoses:  Left hip pain   Xray and pain meds Pt improved in ED.  Xray no acute findings, in proper position, reviewed.  Fup with pcp and ortho. No concern for septic hip or vascular event at this time. Sciatic on differential as well.   Results and differential diagnosis were discussed with the patient. Close follow up outpatient was discussed, patient comfortable with the plan.   Filed Vitals:   03/14/14 1115  BP: 136/71  Pulse: 66  Temp: 98.1 F (36.7 C)  TempSrc: Oral  Resp: 18  SpO2: 100%       Enid SkeensJoshua M Ellasyn Swilling, MD 03/14/14 1317

## 2014-03-14 NOTE — ED Notes (Signed)
Patient states yesterday her left hip began to hurt, she took ibuprofen and went to bed. Woke up in the middle of the night and the pain was much worse. Today she is having difficulty bearing any weight. She had a hip replacement in 08

## 2014-03-14 NOTE — ED Notes (Signed)
Patient transported to X-ray 

## 2014-03-14 NOTE — ED Notes (Signed)
MD at bedside. 

## 2014-04-08 ENCOUNTER — Other Ambulatory Visit (HOSPITAL_COMMUNITY): Payer: Self-pay | Admitting: Orthopedic Surgery

## 2014-04-08 DIAGNOSIS — M25559 Pain in unspecified hip: Secondary | ICD-10-CM

## 2014-04-08 DIAGNOSIS — M545 Low back pain, unspecified: Secondary | ICD-10-CM

## 2014-04-20 ENCOUNTER — Ambulatory Visit (HOSPITAL_COMMUNITY)
Admission: RE | Admit: 2014-04-20 | Discharge: 2014-04-20 | Disposition: A | Discharge status: Home / Self Care | Payer: Medicare Other | Source: Ambulatory Visit | Attending: Orthopedic Surgery | Admitting: Orthopedic Surgery

## 2014-04-20 DIAGNOSIS — R5383 Other fatigue: Secondary | ICD-10-CM

## 2014-04-20 DIAGNOSIS — M545 Low back pain, unspecified: Secondary | ICD-10-CM

## 2014-04-20 DIAGNOSIS — R5381 Other malaise: Secondary | ICD-10-CM | POA: Insufficient documentation

## 2014-04-20 DIAGNOSIS — M25559 Pain in unspecified hip: Secondary | ICD-10-CM | POA: Insufficient documentation

## 2014-08-26 DIAGNOSIS — E669 Obesity, unspecified: Secondary | ICD-10-CM | POA: Insufficient documentation

## 2015-05-21 ENCOUNTER — Emergency Department (HOSPITAL_BASED_OUTPATIENT_CLINIC_OR_DEPARTMENT_OTHER): Payer: Medicare Other

## 2015-05-21 ENCOUNTER — Emergency Department (HOSPITAL_BASED_OUTPATIENT_CLINIC_OR_DEPARTMENT_OTHER)
Admission: EM | Admit: 2015-05-21 | Discharge: 2015-05-21 | Disposition: A | Discharge status: Home / Self Care | Payer: Medicare Other | Attending: Emergency Medicine | Admitting: Emergency Medicine

## 2015-05-21 ENCOUNTER — Encounter (HOSPITAL_BASED_OUTPATIENT_CLINIC_OR_DEPARTMENT_OTHER): Payer: Self-pay

## 2015-05-21 DIAGNOSIS — G8929 Other chronic pain: Secondary | ICD-10-CM | POA: Diagnosis not present

## 2015-05-21 DIAGNOSIS — R600 Localized edema: Secondary | ICD-10-CM | POA: Diagnosis not present

## 2015-05-21 DIAGNOSIS — M25552 Pain in left hip: Secondary | ICD-10-CM | POA: Diagnosis present

## 2015-05-21 DIAGNOSIS — R609 Edema, unspecified: Secondary | ICD-10-CM

## 2015-05-21 DIAGNOSIS — E119 Type 2 diabetes mellitus without complications: Secondary | ICD-10-CM | POA: Insufficient documentation

## 2015-05-21 DIAGNOSIS — Z79899 Other long term (current) drug therapy: Secondary | ICD-10-CM | POA: Insufficient documentation

## 2015-05-21 DIAGNOSIS — I10 Essential (primary) hypertension: Secondary | ICD-10-CM | POA: Insufficient documentation

## 2015-05-21 DIAGNOSIS — M5432 Sciatica, left side: Secondary | ICD-10-CM | POA: Diagnosis not present

## 2015-05-21 LAB — COMPREHENSIVE METABOLIC PANEL
ALBUMIN: 3.8 g/dL (ref 3.5–5.0)
ALK PHOS: 109 U/L (ref 38–126)
ALT: 10 U/L — ABNORMAL LOW (ref 14–54)
ANION GAP: 7 (ref 5–15)
AST: 12 U/L — AB (ref 15–41)
BILIRUBIN TOTAL: 0.4 mg/dL (ref 0.3–1.2)
BUN: 16 mg/dL (ref 6–20)
CHLORIDE: 109 mmol/L (ref 101–111)
CO2: 26 mmol/L (ref 22–32)
CREATININE: 0.83 mg/dL (ref 0.44–1.00)
Calcium: 9 mg/dL (ref 8.9–10.3)
GFR calc Af Amer: >60 mL/min (ref >60)
GLUCOSE: 130 mg/dL — AB (ref 65–99)
Potassium: 3.5 mmol/L (ref 3.5–5.1)
SODIUM: 142 mmol/L (ref 135–145)
TOTAL PROTEIN: 6.9 g/dL (ref 6.5–8.1)

## 2015-05-21 LAB — CBC WITH DIFFERENTIAL/PLATELET
BASOS PCT: 1 % (ref 0–1)
Basophils Absolute: 0 10*3/uL (ref 0.0–0.1)
EOS PCT: 5 % (ref 0–5)
Eosinophils Absolute: 0.3 10*3/uL (ref 0.0–0.7)
HCT: 39.1 % (ref 36.0–46.0)
Hemoglobin: 12.4 g/dL (ref 12.0–15.0)
LYMPHS ABS: 2.1 10*3/uL (ref 0.7–4.0)
LYMPHS PCT: 37 % (ref 12–46)
MCH: 30.3 pg (ref 26.0–34.0)
MCHC: 31.7 g/dL (ref 30.0–36.0)
MCV: 95.6 fL (ref 78.0–100.0)
MONOS PCT: 11 % (ref 3–12)
Monocytes Absolute: 0.7 10*3/uL (ref 0.1–1.0)
NEUTROS PCT: 46 % (ref 43–77)
Neutro Abs: 2.7 10*3/uL (ref 1.7–7.7)
PLATELETS: 194 10*3/uL (ref 150–400)
RBC: 4.09 MIL/uL (ref 3.87–5.11)
RDW: 14.2 % (ref 11.5–15.5)
WBC: 5.8 10*3/uL (ref 4.0–10.5)

## 2015-05-21 LAB — TSH: TSH: 2.245 u[IU]/mL (ref 0.350–4.500)

## 2015-05-21 MED ORDER — HYDROCODONE-ACETAMINOPHEN 5-325 MG PO TABS: 1.0000 | ORAL_TABLET | ORAL | Status: DC | PRN

## 2015-05-21 MED ORDER — FUROSEMIDE 20 MG PO TABS: 20.0000 mg | ORAL_TABLET | Freq: Every day | ORAL | Status: DC

## 2015-05-21 NOTE — ED Provider Notes (Signed)
CSN: [id number]     Arrival date & time 05/21/15  1337 History   First MD Initiated Contact with Patient 05/21/15 1354     Chief Complaint  Patient presents with  . Hip Pain     HPI  Patient presents for evaluation of left hip pain, as well as swelling of her legs she is intermittent and swelling over the last several years. 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 Pain today is more left buttock radiating down the posterior aspect of left leg. No falls injuries trauma. No shortness of breath cough PND or orthopnea. Normal urine output.  Past Medical History  Diagnosis Date  . Diabetes mellitus   . Hypertension    Past Surgical History  Procedure Laterality Date  . Total hip arthroplasty    . Cholecystectomy  10/15/11   History reviewed. No pertinent family history. History  Substance Use Topics  . Smoking status: Passive Smoke Exposure - Never Smoker  . Smokeless tobacco: Never Used  . Alcohol Use: No   OB History    No data available     Review of Systems  Constitutional: Negative for fever, chills, diaphoresis, appetite change and fatigue.  HENT: Negative for mouth sores, sore throat and trouble swallowing.   Eyes: Negative for visual disturbance.  Respiratory: Negative for cough, chest tightness, shortness of breath and wheezing.   Cardiovascular: Positive for leg swelling. Negative for chest pain.  Gastrointestinal: Negative for nausea, vomiting, abdominal pain, diarrhea and abdominal distention.  Endocrine: Negative for polydipsia, polyphagia and polyuria.  Genitourinary: Negative for dysuria, frequency and hematuria.  Musculoskeletal: Negative for gait problem.       Pain left buttock.  Skin: Negative for color change, pallor and rash.  Neurological: Negative for dizziness, syncope, light-headedness and headaches.  Hematological: Does not bruise/bleed easily.  Psychiatric/Behavioral: Negative for behavioral problems and confusion.       Allergies  Morphine and related  Home Medications   Prior to Admission medications   Medication Sig Start Date End Date Taking? Authorizing Provider  furosemide (LASIX) 20 MG tablet Take 1 tablet (20 mg total) by mouth daily. 05/21/15   Rolland Porter, MD  HYDROcodone-acetaminophen (NORCO/VICODIN) 5-325 MG per tablet Take 1-2 tablets by mouth every 4 (four) hours as needed. 05/21/15   Rolland Porter, MD  losartan (COZAAR) 25 MG tablet Take 25 mg by mouth daily.      Historical Provider, MD  metFORMIN (GLUCOPHAGE) 500 MG tablet Take 500 mg by mouth 2 (two) times daily with a meal.      Historical Provider, MD  methocarbamol (ROBAXIN) 500 MG tablet Take 1 tablet (500 mg total) by mouth 2 (two) times daily. 08/27/13   Rolland Porter, MD  Multiple Vitamins-Minerals-FA (VITATAB MV) 2.8 MG TABS Take 2 tablets by mouth daily.      Historical Provider, MD  omeprazole (PRILOSEC) 20 MG capsule  11/10/11   Historical Provider, MD  ONE TOUCH ULTRA TEST test strip  11/06/11   Historical Provider, MD  Dola Argyle LANCETS MISC  11/06/11   Historical Provider, MD   BP 143/64 mmHg  Pulse 81  Temp(Src) 98.3 F (36.8 C) (Oral)  Resp 18  Wt 211 lb (95.709 kg)  SpO2 99% Physical Exam  Constitutional: She is oriented to person, place, and time. She appears well-developed and well-nourished. No distress.  HENT:  Head: Normocephalic.  Eyes: Conjunctivae are normal. Pupils are equal, round, and reactive to light. No scleral icterus.  Neck: Normal  range of motion. Neck supple. No thyromegaly present.  Cardiovascular: Normal rate and regular rhythm.  Exam reveals no gallop and no friction rub.   No murmur heard. Pulmonary/Chest: Effort normal and breath sounds normal. No respiratory distress. She has no wheezes. She has no rales.  Abdominal: Soft. Bowel sounds are normal. She exhibits no distension. There is no tenderness. There is no rebound.  Musculoskeletal: Normal range of motion.        Legs: Neurological: She is alert and oriented to person, place, and time.  Skin: Skin is warm and dry. No rash noted.  2+ symmetric lower extremity edema.  Psychiatric: She has a normal mood and affect. Her behavior is normal.    ED Course  Procedures (including critical care time) Labs Review Labs Reviewed  COMPREHENSIVE METABOLIC PANEL - Abnormal; Notable for the following:    Glucose, Bld 130 (*)    AST 12 (*)    ALT 10 (*)    All other components within normal limits  CBC WITH DIFFERENTIAL/PLATELET  TSH    Imaging Review Dg Chest 2 View  05/21/2015   CLINICAL DATA:  Pt states that since yesterday she has had bi-lateral lower leg and ankle swelling. Denies injury, denies chest pain and sob. Hx of htn and dm  EXAM: CHEST  2 VIEW  COMPARISON:  10/15/2011  FINDINGS: Cardiac silhouette normal in size and configuration. Normal mediastinal and hilar contours. Bilateral prominence of the bronchovascular markings. No convincing pulmonary edema. No evidence of pneumonia. No pleural effusion or pneumothorax.  Bony thorax is demineralized but grossly intact.  IMPRESSION: No active cardiopulmonary disease.   Electronically Signed   By: Amie Portlandavid  Ormond M.D.   On: 05/21/2015 14:35     EKG Interpretation None      MDM   Final diagnoses:  Edema  Sciatica, left    Labs and studies are reassuring. No signs of CHF or hepatic or renal insufficiency. I think this is supple dependent edema. Her pain is secondary to sciatica. Plan will be simple pain control. She has contraindications to NSAIDs, and sterilely with her diabetes. Daily Lasix times the next 6 days. Primary care follow-up if not improving with the hip regarding possibility for outpatient physical therapy.   Rolland PorterMark Shoua Ulloa, MD 05/21/15 [phone]

## 2015-05-21 NOTE — Discharge Instructions (Signed)
Edema °Edema is an abnormal buildup of fluids in your body tissues. Edema is somewhat dependent on gravity to pull the fluid to the lowest place in your body. That makes the condition more common in the legs and thighs (lower extremities). Painless swelling of the feet and ankles is common and becomes more likely as you get older. It is also common in looser tissues, like around your eyes.  °When the affected area is squeezed, the fluid may move out of that spot and leave a dent for a few moments. This dent is called pitting.  °CAUSES  °There are many possible causes of edema. Eating too much salt and being on your feet or sitting for a long time can cause edema in your legs and ankles. Hot weather may make edema worse. Common medical causes of edema include: °· Heart failure. °· Liver disease. °· Kidney disease. °· Weak blood vessels in your legs. °· Cancer. °· An injury. °· Pregnancy. °· Some medications. °· Obesity.  °SYMPTOMS  °Edema is usually painless. Your skin may look swollen or shiny.  °DIAGNOSIS  °Your health care provider may be able to diagnose edema by asking about your medical history and doing a physical exam. You may need to have tests such as X-rays, an electrocardiogram, or blood tests to check for medical conditions that may cause edema.  °TREATMENT  °Edema treatment depends on the cause. If you have heart, liver, or kidney disease, you need the treatment appropriate for these conditions. General treatment may include: °· Elevation of the affected body part above the level of your heart. °· Compression of the affected body part. Pressure from elastic bandages or support stockings squeezes the tissues and forces fluid back into the blood vessels. This keeps fluid from entering the tissues. °· Restriction of fluid and salt intake. °· Use of a water pill (diuretic). These medications are appropriate only for some types of edema. They pull fluid out of your body and make you urinate more often. This  gets rid of fluid and reduces swelling, but diuretics can have side effects. Only use diuretics as directed by your health care provider. °HOME CARE INSTRUCTIONS  °· Keep the affected body part above the level of your heart when you are lying down.   °· Do not sit still or stand for prolonged periods.   °· Do not put anything directly under your knees when lying down. °· Do not wear constricting clothing or garters on your upper legs.   °· Exercise your legs to work the fluid back into your blood vessels. This may help the swelling go down.   °· Wear elastic bandages or support stockings to reduce ankle swelling as directed by your health care provider.   °· Eat a low-salt diet to reduce fluid if your health care provider recommends it.   °· Only take medicines as directed by your health care provider.  °SEEK MEDICAL CARE IF:  °· Your edema is not responding to treatment. °· You have heart, liver, or kidney disease and notice symptoms of edema. °· You have edema in your legs that does not improve after elevating them.   °· You have sudden and unexplained weight gain. °SEEK IMMEDIATE MEDICAL CARE IF:  °· You develop shortness of breath or chest pain.   °· You cannot breathe when you lie down. °· You develop pain, redness, or warmth in the swollen areas.   °· You have heart, liver, or kidney disease and suddenly get edema. °· You have a fever and your symptoms suddenly get worse. °MAKE SURE YOU:  °·   Understand these instructions.  Will watch your condition.  Will get help right away if you are not doing well or get worse. Document Released: 12/10/2005 Document Revised: 04/26/2014 Document Reviewed: 10/02/2013 [facility] Patient Information 2015 [place], [place]. This information is not intended to replace advice given to you by your health care provider. Make sure you discuss any questions you have with your health care provider.  Sciatica Sciatica is pain, weakness, numbness, or tingling along the path of the  sciatic nerve. The nerve starts in the lower back and runs down the back of each leg. The nerve controls the muscles in the lower leg and in the back of the knee, while also providing sensation to the back of the thigh, lower leg, and the sole of your foot. Sciatica is a symptom of another medical condition. For instance, nerve damage or certain conditions, such as a herniated disk or bone spur on the spine, pinch or put pressure on the sciatic nerve. This causes the pain, weakness, or other sensations normally associated with sciatica. Generally, sciatica only affects one side of the body. CAUSES   Herniated or slipped disc.  Degenerative disk disease.  A pain disorder involving the narrow muscle in the buttocks (piriformis syndrome).  Pelvic injury or fracture.  Pregnancy.  Tumor (rare). SYMPTOMS  Symptoms can vary from mild to very severe. The symptoms usually travel from the low back to the buttocks and down the back of the leg. Symptoms can include:  Mild tingling or dull aches in the lower back, leg, or hip.  Numbness in the back of the calf or sole of the foot.  Burning sensations in the lower back, leg, or hip.  Sharp pains in the lower back, leg, or hip.  Leg weakness.  Severe back pain inhibiting movement. These symptoms may get worse with coughing, sneezing, laughing, or prolonged sitting or standing. Also, being overweight may worsen symptoms. DIAGNOSIS  Your caregiver will perform a physical exam to look for common symptoms of sciatica. He or she may ask you to do certain movements or activities that would trigger sciatic nerve pain. Other tests may be performed to find the cause of the sciatica. These may include:  Blood tests.  X-rays.  Imaging tests, such as an MRI or CT scan. TREATMENT  Treatment is directed at the cause of the sciatic pain. Sometimes, treatment is not necessary and the pain and discomfort goes away on its own. If treatment is needed, your  caregiver may suggest:  Over-the-counter medicines to relieve pain.  Prescription medicines, such as anti-inflammatory medicine, muscle relaxants, or narcotics.  Applying heat or ice to the painful area.  Steroid injections to lessen pain, irritation, and inflammation around the nerve.  Reducing activity during periods of pain.  Exercising and stretching to strengthen your abdomen and improve flexibility of your spine. Your caregiver may suggest losing weight if the extra weight makes the back pain worse.  Physical therapy.  Surgery to eliminate what is pressing or pinching the nerve, such as a bone spur or part of a herniated disk. HOME CARE INSTRUCTIONS   Only take over-the-counter or prescription medicines for pain or discomfort as directed by your caregiver.  Apply ice to the affected area for 20 minutes, 3-4 times a day for the first 48-72 hours. Then try heat in the same way.  Exercise, stretch, or perform your usual activities if these do not aggravate your pain.  Attend physical therapy sessions as directed by your caregiver.  Keep  all follow-up appointments as directed by your caregiver.  Do not wear high heels or shoes that do not provide proper support.  Check your mattress to see if it is too soft. A firm mattress may lessen your pain and discomfort. SEEK IMMEDIATE MEDICAL CARE IF:   You lose control of your bowel or bladder (incontinence).  You have increasing weakness in the lower back, pelvis, buttocks, or legs.  You have redness or swelling of your back.  You have a burning sensation when you urinate.  You have pain that gets worse when you lie down or awakens you at night.  Your pain is worse than you have experienced in the past.  Your pain is lasting longer than 4 weeks.  You are suddenly losing weight without reason. MAKE SURE YOU:  Understand these instructions.  Will watch your condition.  Will get help right away if you are not doing well  or get worse. Document Released: 12/04/2001 Document Revised: 06/10/2012 Document Reviewed: 04/20/2012 [facility] Patient Information 2015 [place], [place]. This information is not intended to replace advice given to you by your health care provider. Make sure you discuss any questions you have with your health care provider.

## 2015-05-21 NOTE — ED Notes (Addendum)
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 Bilateral ankle and foot swelling.

## 2015-07-24 DIAGNOSIS — Z87891 Personal history of nicotine dependence: Secondary | ICD-10-CM | POA: Diagnosis not present

## 2015-07-24 DIAGNOSIS — R0789 Other chest pain: Secondary | ICD-10-CM | POA: Diagnosis not present

## 2015-07-24 DIAGNOSIS — E663 Overweight: Secondary | ICD-10-CM | POA: Diagnosis not present

## 2015-07-24 DIAGNOSIS — Z79899 Other long term (current) drug therapy: Secondary | ICD-10-CM | POA: Insufficient documentation

## 2015-07-24 DIAGNOSIS — R9431 Abnormal electrocardiogram [ECG] [EKG]: Secondary | ICD-10-CM | POA: Diagnosis not present

## 2015-07-24 DIAGNOSIS — E785 Hyperlipidemia, unspecified: Secondary | ICD-10-CM | POA: Diagnosis not present

## 2015-07-24 DIAGNOSIS — I10 Essential (primary) hypertension: Secondary | ICD-10-CM | POA: Diagnosis not present

## 2015-07-24 DIAGNOSIS — Z96649 Presence of unspecified artificial hip joint: Secondary | ICD-10-CM | POA: Diagnosis not present

## 2015-07-24 DIAGNOSIS — R110 Nausea: Secondary | ICD-10-CM | POA: Insufficient documentation

## 2015-07-24 DIAGNOSIS — Z79891 Long term (current) use of opiate analgesic: Secondary | ICD-10-CM | POA: Insufficient documentation

## 2015-07-24 DIAGNOSIS — R079 Chest pain, unspecified: Secondary | ICD-10-CM | POA: Diagnosis present

## 2015-07-24 DIAGNOSIS — E119 Type 2 diabetes mellitus without complications: Secondary | ICD-10-CM | POA: Insufficient documentation

## 2015-07-24 DIAGNOSIS — Z6834 Body mass index (BMI) 34.0-34.9, adult: Secondary | ICD-10-CM | POA: Insufficient documentation

## 2015-07-25 ENCOUNTER — Encounter (HOSPITAL_COMMUNITY)
Admission: EM | Disposition: A | Discharge status: Other Acute Inpatient Hospital | Payer: Medicare Other | Source: Home / Self Care | Attending: Emergency Medicine

## 2015-07-25 ENCOUNTER — Encounter (HOSPITAL_BASED_OUTPATIENT_CLINIC_OR_DEPARTMENT_OTHER): Payer: Self-pay | Admitting: *Deleted

## 2015-07-25 ENCOUNTER — Emergency Department (HOSPITAL_BASED_OUTPATIENT_CLINIC_OR_DEPARTMENT_OTHER): Payer: Medicare Other

## 2015-07-25 ENCOUNTER — Observation Stay (HOSPITAL_BASED_OUTPATIENT_CLINIC_OR_DEPARTMENT_OTHER)
Admission: EM | Admit: 2015-07-25 | Discharge: 2015-07-25 | Disposition: A | Discharge status: Other Acute Inpatient Hospital | Payer: Medicare Other | Attending: Internal Medicine | Admitting: Internal Medicine

## 2015-07-25 DIAGNOSIS — R079 Chest pain, unspecified: Secondary | ICD-10-CM | POA: Diagnosis not present

## 2015-07-25 DIAGNOSIS — E669 Obesity, unspecified: Secondary | ICD-10-CM

## 2015-07-25 DIAGNOSIS — E119 Type 2 diabetes mellitus without complications: Secondary | ICD-10-CM | POA: Diagnosis not present

## 2015-07-25 DIAGNOSIS — I10 Essential (primary) hypertension: Secondary | ICD-10-CM | POA: Diagnosis present

## 2015-07-25 DIAGNOSIS — R0789 Other chest pain: Secondary | ICD-10-CM | POA: Diagnosis not present

## 2015-07-25 DIAGNOSIS — E1169 Type 2 diabetes mellitus with other specified complication: Secondary | ICD-10-CM

## 2015-07-25 DIAGNOSIS — R110 Nausea: Secondary | ICD-10-CM | POA: Diagnosis not present

## 2015-07-25 DIAGNOSIS — K219 Gastro-esophageal reflux disease without esophagitis: Secondary | ICD-10-CM

## 2015-07-25 HISTORY — PX: CARDIAC CATHETERIZATION: SHX172

## 2015-07-25 LAB — CBG MONITORING, ED: Glucose-Capillary: 105 mg/dL — ABNORMAL HIGH (ref 65–99)

## 2015-07-25 LAB — LIPID PANEL
CHOLESTEROL: 168 mg/dL (ref 0–200)
HDL: 57 mg/dL (ref >40)
LDL CALC: 96 mg/dL (ref 0–99)
Total CHOL/HDL Ratio: 2.9 RATIO
Triglycerides: 73 mg/dL (ref <150)
VLDL: 15 mg/dL (ref 0–40)

## 2015-07-25 LAB — I-STAT CHEM 8, ED
BUN: 20 mg/dL (ref 6–20)
Calcium, Ion: 1.24 mmol/L (ref 1.13–1.30)
Chloride: 105 mmol/L (ref 101–111)
Creatinine, Ser: 1 mg/dL (ref 0.44–1.00)
Glucose, Bld: 106 mg/dL — ABNORMAL HIGH (ref 65–99)
HEMATOCRIT: 46 % (ref 36.0–46.0)
HEMOGLOBIN: 15.6 g/dL — AB (ref 12.0–15.0)
POTASSIUM: 4 mmol/L (ref 3.5–5.1)
Sodium: 142 mmol/L (ref 135–145)
TCO2: 24 mmol/L (ref 0–100)

## 2015-07-25 LAB — CBC WITH DIFFERENTIAL/PLATELET
BASOS PCT: 1 % (ref 0–1)
Basophils Absolute: 0.1 10*3/uL (ref 0.0–0.1)
Eosinophils Absolute: 0.4 10*3/uL (ref 0.0–0.7)
Eosinophils Relative: 6 % — ABNORMAL HIGH (ref 0–5)
HEMATOCRIT: 43.1 % (ref 36.0–46.0)
Hemoglobin: 13.7 g/dL (ref 12.0–15.0)
Lymphocytes Relative: 46 % (ref 12–46)
Lymphs Abs: 2.7 10*3/uL (ref 0.7–4.0)
MCH: 30.2 pg (ref 26.0–34.0)
MCHC: 31.8 g/dL (ref 30.0–36.0)
MCV: 94.9 fL (ref 78.0–100.0)
MONO ABS: 0.6 10*3/uL (ref 0.1–1.0)
Monocytes Relative: 10 % (ref 3–12)
NEUTROS ABS: 2.2 10*3/uL (ref 1.7–7.7)
NEUTROS PCT: 37 % — AB (ref 43–77)
PLATELETS: 208 10*3/uL (ref 150–400)
RBC: 4.54 MIL/uL (ref 3.87–5.11)
RDW: 14.6 % (ref 11.5–15.5)
WBC: 5.9 10*3/uL (ref 4.0–10.5)

## 2015-07-25 LAB — PROTIME-INR
INR: 1.16 (ref 0.00–1.49)
Prothrombin Time: 15 seconds (ref 11.6–15.2)

## 2015-07-25 LAB — TROPONIN I
Troponin I: <0.03 ng/mL (ref <0.031)
Troponin I: <0.03 ng/mL (ref <0.031)

## 2015-07-25 LAB — GLUCOSE, CAPILLARY: GLUCOSE-CAPILLARY: 94 mg/dL (ref 65–99)

## 2015-07-25 SURGERY — LEFT HEART CATH AND CORONARY ANGIOGRAPHY
Anesthesia: LOCAL

## 2015-07-25 MED ORDER — VERAPAMIL HCL 2.5 MG/ML IV SOLN
INTRAVENOUS | Status: AC
  Filled 2015-07-25: qty 2

## 2015-07-25 MED ORDER — PANTOPRAZOLE SODIUM 40 MG PO TBEC: 40.0000 mg | DELAYED_RELEASE_TABLET | Freq: Every day | ORAL | Status: DC

## 2015-07-25 MED ORDER — SODIUM CHLORIDE 0.9 % IV SOLN: 250.0000 mL | INTRAVENOUS | Status: DC | PRN

## 2015-07-25 MED ORDER — ONDANSETRON HCL 4 MG/2ML IJ SOLN: 4.0000 mg | Freq: Four times a day (QID) | INTRAMUSCULAR | Status: DC | PRN

## 2015-07-25 MED ORDER — ASPIRIN 81 MG PO CHEW: 81.0000 mg | CHEWABLE_TABLET | Freq: Every day | ORAL | Status: DC

## 2015-07-25 MED ORDER — HEPARIN SODIUM (PORCINE) 5000 UNIT/ML IJ SOLN
5000.0000 [IU] | Freq: Three times a day (TID) | INTRAMUSCULAR | Status: DC
  Administered 2015-07-25 (×2): 5000 [IU] via SUBCUTANEOUS
  Filled 2015-07-25 (×2): qty 1

## 2015-07-25 MED ORDER — LOSARTAN POTASSIUM 25 MG PO TABS: 25.0000 mg | ORAL_TABLET | Freq: Every day | ORAL | Status: DC

## 2015-07-25 MED ORDER — OMEPRAZOLE 20 MG PO CPDR: DELAYED_RELEASE_CAPSULE | ORAL | Status: AC

## 2015-07-25 MED ORDER — GI COCKTAIL ~~LOC~~
30.0000 mL | Freq: Once | ORAL | Status: AC
  Administered 2015-07-25: 30 mL via ORAL
  Filled 2015-07-25: qty 30

## 2015-07-25 MED ORDER — ACETAMINOPHEN 325 MG PO TABS
650.0000 mg | ORAL_TABLET | ORAL | Status: DC | PRN
  Administered 2015-07-25: 650 mg via ORAL
  Filled 2015-07-25: qty 2

## 2015-07-25 MED ORDER — SODIUM CHLORIDE 0.9 % IJ SOLN: 3.0000 mL | Freq: Two times a day (BID) | INTRAMUSCULAR | Status: DC

## 2015-07-25 MED ORDER — FENTANYL CITRATE (PF) 100 MCG/2ML IJ SOLN
INTRAMUSCULAR | Status: DC | PRN
  Administered 2015-07-25: 25 ug via INTRAVENOUS

## 2015-07-25 MED ORDER — NITROGLYCERIN 2 % TD OINT
1.0000 [in_us] | TOPICAL_OINTMENT | Freq: Once | TRANSDERMAL | Status: AC
  Administered 2015-07-25: 1 [in_us] via TOPICAL
  Filled 2015-07-25: qty 1

## 2015-07-25 MED ORDER — HYDROCODONE-ACETAMINOPHEN 5-325 MG PO TABS
1.0000 | ORAL_TABLET | ORAL | Status: DC | PRN
  Administered 2015-07-25: 2 via ORAL
  Filled 2015-07-25: qty 2

## 2015-07-25 MED ORDER — RADIAL COCKTAIL (HEPARIN/VERAPAMIL/LIDOCAINE/NITRO)
Status: DC | PRN
  Administered 2015-07-25: 1 via INTRA_ARTERIAL

## 2015-07-25 MED ORDER — SODIUM CHLORIDE 0.9 % IV SOLN: INTRAVENOUS | Status: DC

## 2015-07-25 MED ORDER — SODIUM CHLORIDE 0.9 % WEIGHT BASED INFUSION
3.0000 mL/kg/h | INTRAVENOUS | Status: AC
  Administered 2015-07-25: 3 mL/kg/h via INTRAVENOUS

## 2015-07-25 MED ORDER — SODIUM CHLORIDE 0.9 % IJ SOLN: 3.0000 mL | INTRAMUSCULAR | Status: DC | PRN

## 2015-07-25 MED ORDER — INSULIN ASPART 100 UNIT/ML ~~LOC~~ SOLN: 0.0000 [IU] | SUBCUTANEOUS | Status: DC

## 2015-07-25 MED ORDER — HEPARIN SODIUM (PORCINE) 1000 UNIT/ML IJ SOLN
INTRAMUSCULAR | Status: AC
  Filled 2015-07-25: qty 1

## 2015-07-25 MED ORDER — ASPIRIN 81 MG PO CHEW: 81.0000 mg | CHEWABLE_TABLET | ORAL | Status: DC

## 2015-07-25 MED ORDER — ACETAMINOPHEN 325 MG PO TABS: 650.0000 mg | ORAL_TABLET | ORAL | Status: DC | PRN

## 2015-07-25 MED ORDER — ACETAMINOPHEN 325 MG PO TABS
650.0000 mg | ORAL_TABLET | Freq: Once | ORAL | Status: AC
  Administered 2015-07-25: 650 mg via ORAL
  Filled 2015-07-25: qty 2

## 2015-07-25 MED ORDER — FENTANYL CITRATE (PF) 100 MCG/2ML IJ SOLN
INTRAMUSCULAR | Status: AC
  Filled 2015-07-25: qty 2

## 2015-07-25 MED ORDER — MIDAZOLAM HCL 2 MG/2ML IJ SOLN
INTRAMUSCULAR | Status: DC | PRN
  Administered 2015-07-25: 1 mg via INTRAVENOUS

## 2015-07-25 MED ORDER — METFORMIN HCL 500 MG PO TABS: 500.0000 mg | ORAL_TABLET | Freq: Two times a day (BID) | ORAL | Status: AC

## 2015-07-25 MED ORDER — HEPARIN (PORCINE) IN NACL 2-0.9 UNIT/ML-% IJ SOLN
INTRAMUSCULAR | Status: AC
  Filled 2015-07-25: qty 1000

## 2015-07-25 MED ORDER — ASPIRIN 81 MG PO CHEW
324.0000 mg | CHEWABLE_TABLET | Freq: Once | ORAL | Status: AC
  Administered 2015-07-25: 324 mg via ORAL
  Filled 2015-07-25: qty 4

## 2015-07-25 MED ORDER — HEPARIN SODIUM (PORCINE) 1000 UNIT/ML IJ SOLN
INTRAMUSCULAR | Status: DC | PRN
  Administered 2015-07-25: 5000 [IU] via INTRAVENOUS

## 2015-07-25 MED ORDER — TIZANIDINE HCL 4 MG PO TABS
4.0000 mg | ORAL_TABLET | Freq: Three times a day (TID) | ORAL | Status: DC | PRN
  Filled 2015-07-25: qty 1

## 2015-07-25 MED ORDER — NITROGLYCERIN 0.4 MG SL SUBL
0.4000 mg | SUBLINGUAL_TABLET | Freq: Once | SUBLINGUAL | Status: AC
  Administered 2015-07-25: 0.4 mg via SUBLINGUAL

## 2015-07-25 MED ORDER — NITROGLYCERIN 1 MG/10 ML FOR IR/CATH LAB
INTRA_ARTERIAL | Status: AC
  Filled 2015-07-25: qty 10

## 2015-07-25 MED ORDER — MIDAZOLAM HCL 2 MG/2ML IJ SOLN
INTRAMUSCULAR | Status: AC
  Filled 2015-07-25: qty 2

## 2015-07-25 MED ORDER — LIDOCAINE HCL (PF) 1 % IJ SOLN
INTRAMUSCULAR | Status: AC
  Filled 2015-07-25: qty 30

## 2015-07-25 SURGICAL SUPPLY — 12 items
CATH INFINITI 5FR ANG PIGTAIL (CATHETERS) ×2 IMPLANT
CATH OPTITORQUE TIG 4.0 5F (CATHETERS) ×2 IMPLANT
DEVICE RAD COMP TR BAND LRG (VASCULAR PRODUCTS) ×2 IMPLANT
GLIDESHEATH SLEND A-KIT 6F 22G (SHEATH) ×2 IMPLANT
KIT HEART LEFT (KITS) ×2 IMPLANT
PACK CARDIAC CATHETERIZATION (CUSTOM PROCEDURE TRAY) ×2 IMPLANT
SYR MEDRAD MARK V 150ML (SYRINGE) ×2 IMPLANT
TRANSDUCER W/STOPCOCK (MISCELLANEOUS) ×2 IMPLANT
TUBING CIL FLEX 10 FLL-RA (TUBING) ×2 IMPLANT
WIRE EMERALD 3MM-J .035X150CM (WIRE) IMPLANT
WIRE HI TORQ VERSACORE-J 145CM (WIRE) ×2 IMPLANT
WIRE SAFE-T 1.5MM-J .035X260CM (WIRE) ×2 IMPLANT

## 2015-07-25 NOTE — ED Notes (Signed)
Dr.Horton into speak with pt at time of transfer, no changes, denies pain or questions. VSS.  [facility] here, CBG 105.

## 2015-07-25 NOTE — ED Provider Notes (Signed)
CSN: [id number]     Arrival date & time 07/24/15  2354 History  This chart was scribed for Shon Baton, MD by Budd Palmer, ED Scribe. This patient was seen in room MH06/MH06 and the patient's care was started at 12:16 AM.    Chief Complaint  Patient presents with  . Chest Pain   The history is provided by the patient. No language interpreter was used.   HPI Comments: Kara Montoya is a 74 y.o. female who presents to the Emergency Department complaining of constant, pressure-like CP onset 8 hours ago. She rates the pain as 8/10 now, and at 10/10 at onset. She reports associated diaphoresis. She notes that she had eaten dinner 2 hours before. Pt has a PMHx of DM and HTN. She has taken omeprazole with no relief. She denies a PMHx of heart disease. She has not taken an aspirin today. Pt denies nausea.  Denies exertional component to the pain.  Past Medical History  Diagnosis Date  . Diabetes mellitus   . Hypertension    Past Surgical History  Procedure Laterality Date  . Total hip arthroplasty    . Cholecystectomy  10/15/11   No family history on file. History  Substance Use Topics  . Smoking status: Passive Smoke Exposure - Never Smoker  . Smokeless tobacco: Never Used  . Alcohol Use: No   OB History    No data available     Review of Systems  Constitutional: Positive for diaphoresis. Negative for fever.  Respiratory: Negative for cough, chest tightness and shortness of breath.   Cardiovascular: Positive for chest pain. Negative for leg swelling.  Gastrointestinal: Positive for nausea. Negative for vomiting and abdominal pain.  Genitourinary: Negative for dysuria.  Musculoskeletal: Negative for back pain.  All other systems reviewed and are negative.   Allergies  Morphine and related  Home Medications   Prior to Admission medications   Medication Sig Start Date End Date Taking? Authorizing Provider  furosemide (LASIX) 20 MG tablet Take 1 tablet (20 mg total)  by mouth daily. 05/21/15   Rolland Porter, MD  HYDROcodone-acetaminophen (NORCO/VICODIN) 5-325 MG per tablet Take 1-2 tablets by mouth every 4 (four) hours as needed. 05/21/15   Rolland Porter, MD  losartan (COZAAR) 25 MG tablet Take 25 mg by mouth daily.      Historical Provider, MD  metFORMIN (GLUCOPHAGE) 500 MG tablet Take 500 mg by mouth 2 (two) times daily with a meal.      Historical Provider, MD  methocarbamol (ROBAXIN) 500 MG tablet Take 1 tablet (500 mg total) by mouth 2 (two) times daily. 08/27/13   Rolland Porter, MD  Multiple Vitamins-Minerals-FA (VITATAB MV) 2.8 MG TABS Take 2 tablets by mouth daily.      Historical Provider, MD  omeprazole (PRILOSEC) 20 MG capsule  11/10/11   Historical Provider, MD  ONE TOUCH ULTRA TEST test strip  11/06/11   Historical Provider, MD  Dola Argyle LANCETS MISC  11/06/11   Historical Provider, MD   BP 130/60 mmHg  Pulse 71  Resp 16  Ht 5\' 5"  (1.651 m)  Wt 210 lb (95.255 kg)  BMI 34.95 kg/m2  SpO2 94% Physical Exam  Constitutional: She is oriented to person, place, and time. No distress.  Overweight  HENT:  Head: Normocephalic and atraumatic.  Eyes: EOM are normal. Pupils are equal, round, and reactive to light.  Cardiovascular: Normal rate, regular rhythm and normal heart sounds.   No murmur heard. Pulmonary/Chest: Effort normal and  breath sounds normal. No respiratory distress. She has no wheezes. She exhibits no tenderness.  Abdominal: Soft. Bowel sounds are normal. There is no tenderness. There is no rebound.  Musculoskeletal: She exhibits no edema.  Neurological: She is alert and oriented to person, place, and time.  Skin: Skin is warm and dry.  Psychiatric: She has a normal mood and affect.  Nursing note and vitals reviewed.   ED Course  Procedures  DIAGNOSTIC STUDIES: Oxygen Saturation is 99% on RA, normal by my interpretation.    COORDINATION OF CARE: 12:21 AM - Discussed plans to order NTG paste and apsirin. Will perform diagnostic  studies. Pt advised of plan for treatment and pt agrees.  Labs Review Labs Reviewed  CBC WITH DIFFERENTIAL/PLATELET - Abnormal; Notable for the following:    Neutrophils Relative % 37 (*)    Eosinophils Relative 6 (*)    All other components within normal limits  I-STAT CHEM 8, ED - Abnormal; Notable for the following:    Glucose, Bld 106 (*)    Hemoglobin 15.6 (*)    All other components within normal limits  TROPONIN I    Imaging Review Dg Chest 2 View  07/25/2015   CLINICAL DATA:  Chest pain for 8 hr with diaphoresis.  EXAM: CHEST  2 VIEW  COMPARISON:  05/21/2015  FINDINGS: The cardiomediastinal contours are normal. Mild eventration of right hemidiaphragm and interstitial prominence, unchanged. Pulmonary vasculature is normal. No consolidation, pleural effusion, or pneumothorax. No acute osseous abnormalities are seen. Mild degenerative change in the mid thoracic spine.  IMPRESSION: Stable chronic change without acute pulmonary process.   Electronically Signed   By: Rubye Oaks M.D.   On: 07/25/2015 00:52     EKG Interpretation   Date/Time:  Monday July 25 2015 00:11:22 EDT Ventricular Rate:  65 PR Interval:  174 QRS Duration: 90 QT Interval:  410 QTC Calculation: 426 R Axis:   56 Text Interpretation:  Sinus rhythm with Premature atrial complexes ST \\T \  T wave abnormality, consider inferolateral ischemia Abnormal ECG T wave  inversions inferiorly new ST elevation 1mm isolated to V1 Confirmed by  Morgann Woodburn  MD, Nyjah Denio ([id number]) on 07/25/2015 12:12:43 AM      MDM   Final diagnoses:  Chest pain, unspecified chest pain type    Patient presents for chest pain. Somewhat atypical but has had associated nausea and diaphoresis. Also onset after eating but not improved with PPI. Nontoxic on exam. Heart score 5. EKG shows nonspecific ST changes inferior and laterally. There is also isolated ST elevation in V1. Does not meet STEMI criteria. Patient was given full dose aspirin and  nitroglycerin paste was applied. On recheck, patient reports that pain has improved to 5 out of 10. She is allergic to morphine. Patient was given a GI cocktail. Lab work reassuring with a negative troponin. Chest x-ray is negative. On second recheck, patient reports total improvement of pain.  Her clinical picture is somewhat mixed; however given EKG changes and risk factors, the patient needs admission for a formal chest pain rule out. Discussed with Dr. Toniann Fail who has requested I speak to the cardiologist recording EKG changes. He has accepted the patient contingent on discussion with cardiology. I discussed with Dr. Shirlee Latch.  He recommended formal cardiology evaluation if the patient's troponin increases or she has recurrent chest pain.  I personally performed the services described in this documentation, which was scribed in my presence. The recorded information has been reviewed and is accurate.  Shon Baton, MD 07/25/15 0300

## 2015-07-25 NOTE — Progress Notes (Signed)
Patient admitted earlier today. H&P reviewed. Patient seen and examined.  S: Patient denies any chest tightness currently. No shortness of breath. She did mention that her symptoms started after she ate a meal yesterday afternoon. Denies any difficulty swallowing.  O: Vital signs are all stable. Lungs are clear to auscultation bilaterally. S1, S2 is normal, regular. Abdomen is soft, nontender, nondistended. Bowel sounds are present. No masses or organomegaly.  Troponins are normal.  A/P: Patient presented with chest pain. Pain could be GI in origin, but EKG showed nonspecific findings with T-wave inversions. Seen by cardiology this morning. Plan is for cardiac catheterization later today. Patient has a history of hypertension and diabetes. Monitor blood pressures closely. Continue sliding scale coverage.  Kara Montoya 1:49 PM

## 2015-07-25 NOTE — ED Notes (Signed)
Pt here with CP that she describes as a pressure and this began around 4pm, she states that she had some diaphoresis with this, no sob or n/v.  Pt drove herself in

## 2015-07-25 NOTE — ED Notes (Signed)
Dr. Wilkie Aye at [facility]. Pt to xray at this time.

## 2015-07-25 NOTE — H&P (View-Only) (Signed)
Primary Physician: Primary Cardiologist:  Asked to see re CP    HPI:  Pt is a 74 yo who presented today with CP  Pain was pressure sensation.  Began yesterday. Pt said the spell began after dinner  She did not have a big dinner.  Was sitting on back porch when she developed a indigestion sensation, like had to burp but couldn't.  Mild SOB  SOme sweating on head.   Took omeprazole x 3  No help.  D[place] to ER IN ER was given NTG  Discomfort went away   She has never had before  No other chest pains/discomfort.   Activity limitied by bursitis in hip  Hasnt done water aerobics in over 3 months.   No problmes swallowing things  No hx of GERD     Past Medical History  Diagnosis Date  . Diabetes mellitus   . Hypertension     Medications Prior to Admission  Medication Sig Dispense Refill  . furosemide (LASIX) 20 MG tablet Take 1 tablet (20 mg total) by mouth daily. 6 tablet 0  . HYDROcodone-acetaminophen (NORCO/VICODIN) 5-325 MG per tablet Take 1-2 tablets by mouth every 4 (four) hours as needed. 20 tablet 0  . losartan (COZAAR) 25 MG tablet Take 25 mg by mouth daily.      . metFORMIN (GLUCOPHAGE) 500 MG tablet Take 500 mg by mouth 2 (two) times daily with a meal.      . methocarbamol (ROBAXIN) 500 MG tablet Take 1 tablet (500 mg total) by mouth 2 (two) times daily. 20 tablet 0  . Multiple Vitamins-Minerals-FA (VITATAB MV) 2.8 MG TABS Take 2 tablets by mouth daily.      Marland Kitchen omeprazole (PRILOSEC) 20 MG capsule     . ONE TOUCH ULTRA TEST test strip     . ONETOUCH DELICA LANCETS MISC        . aspirin  81 mg Oral Daily  . heparin  5,000 Units Subcutaneous 3 times per day  . insulin aspart  0-9 Units Subcutaneous 6 times per day  . losartan  25 mg Oral Daily  . pantoprazole  40 mg Oral Daily    Infusions:    Allergies  Allergen Reactions  . Adhesive [Tape] Other (See Comments)    Pt states it removes her skin  . Latex Itching  . Morphine And Related     Sweaty, suppresses  breathing    History   Social History  . Marital Status: Widowed    Spouse Name: N/A  . Number of Children: N/A  . Years of Education: N/A   Occupational History  . Not on file.   Social History Main Topics  . Smoking status: Passive Smoke Exposure - Never Smoker  . Smokeless tobacco: Never Used  . Alcohol Use: No  . Drug Use: No  . Sexual Activity: Not on file   Other Topics Concern  . Not on file   Social History Narrative    History reviewed. No pertinent family history.  REVIEW OF SYSTEMS:  All systems reviewed  Negative to the above problem except as noted above.    PHYSICAL EXAM: Filed Vitals:   07/25/15 0752  BP: 109/56  Pulse: 78  Temp: 97.8 F (36.6 C)  Resp: 16     Intake/Output Summary (Last 24 hours) at 07/25/15 0757 Last data filed at 07/25/15 6962  Gross per 24 hour  Intake    120 ml  Output      0  ml  Net    120 ml    General:  Well appearing. No respiratory difficulty HEENT: normal Neck: supple. no JVD. Carotids 2+ bilat; no bruits. No lymphadenopathy or thryomegaly appreciated. Cor: PMI nondisplaced. Regular rate & rhythm. No rubs, gallops or murmurs. Lungs: clear Abdomen: soft, nontender, nondistended. No hepatosplenomegaly. No bruits or masses. Good bowel sounds. Extremities: no cyanosis, clubbing, rash, edema Neuro: alert & oriented x 3, cranial nerves grossly intact. moves all 4 extremities w/o difficulty. Affect pleasant.  ECG:  SR  Sl ST depression with T wave inversion I, II, AVF, V3 to V6  More prominent than on previous EKG   EKG this AM  T wave inversion inferior and laterally is more prominent    Results for orders placed or performed during the hospital encounter of 07/25/15 (from the past 24 hour(s))  CBC with Differential     Status: Abnormal   Collection Time: 07/25/15 12:11 AM  Result Value Ref Range   WBC 5.9 4.0 - 10.5 K/uL   RBC 4.54 3.87 - 5.11 MIL/uL   Hemoglobin 13.7 12.0 - 15.0 g/dL   HCT 16.1 09.6 - 04.5 %     MCV 94.9 78.0 - 100.0 fL   MCH 30.2 26.0 - 34.0 pg   MCHC 31.8 30.0 - 36.0 g/dL   RDW 40.9 81.1 - 91.4 %   Platelets 208 150 - 400 K/uL   Neutrophils Relative % 37 (L) 43 - 77 %   Neutro Abs 2.2 1.7 - 7.7 K/uL   Lymphocytes Relative 46 12 - 46 %   Lymphs Abs 2.7 0.7 - 4.0 K/uL   Monocytes Relative 10 3 - 12 %   Monocytes Absolute 0.6 0.1 - 1.0 K/uL   Eosinophils Relative 6 (H) 0 - 5 %   Eosinophils Absolute 0.4 0.0 - 0.7 K/uL   Basophils Relative 1 0 - 1 %   Basophils Absolute 0.1 0.0 - 0.1 K/uL  Troponin I     Status: None   Collection Time: 07/25/15 12:11 AM  Result Value Ref Range   Troponin I <0.03 <0.031 ng/mL  I-Stat Chem 8, ED     Status: Abnormal   Collection Time: 07/25/15 12:26 AM  Result Value Ref Range   Sodium 142 135 - 145 mmol/L   Potassium 4.0 3.5 - 5.1 mmol/L   Chloride 105 101 - 111 mmol/L   BUN 20 6 - 20 mg/dL   Creatinine, Ser 7.82 0.44 - 1.00 mg/dL   Glucose, Bld 956 (H) 65 - 99 mg/dL   Calcium, Ion 2.13 0.86 - 1.30 mmol/L   TCO2 24 0 - 100 mmol/L   Hemoglobin 15.6 (H) 12.0 - 15.0 g/dL   HCT 57.8 46.9 - 62.9 %  CBG monitoring, ED     Status: Abnormal   Collection Time: 07/25/15  4:21 AM  Result Value Ref Range   Glucose-Capillary 105 (H) 65 - 99 mg/dL   Dg Chest 2 View  04/24/8412   CLINICAL DATA:  Chest pain for 8 hr with diaphoresis.  EXAM: CHEST  2 VIEW  COMPARISON:  05/21/2015  FINDINGS: The cardiomediastinal contours are normal. Mild eventration of right hemidiaphragm and interstitial prominence, unchanged. Pulmonary vasculature is normal. No consolidation, pleural effusion, or pneumothorax. No acute osseous abnormalities are seen. Mild degenerative change in the mid thoracic spine.  IMPRESSION: Stable chronic change without acute pulmonary process.   Electronically Signed   By: Rubye Oaks M.D.   On: 07/25/2015 00:52  ASSESSMENT: Pt is a 74 yo with a history of HTN nad DM (x 4 years; glu under good control)  Presents with chest pressure   Indigestion  New to her   Pt is not that active.   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  WIll plan for later today  2.  HL  Check lipids  Pt needs to be on a statin.    3  HTN  Follow

## 2015-07-25 NOTE — ED Notes (Signed)
No changes, pt resting, alert, NAD, calm, interactive, no dyspnea noted, skin W&D, (denies: sob, pain, nausea or dizziness), "feel better".

## 2015-07-25 NOTE — H&P (Signed)
[facility] History and Physical  Kara Montoya:[id number] DOB: 01/24/1941 DOA: 07/25/2015  Referring physician: EDP PCP: Doreatha Martin, MD   Chief Complaint: Chest pain   HPI: Kara Montoya is a 74 y.o. female who presents to the ED with c/o chest pain.  Pain is constant, pressure-like in qualtiy, and onset 8 hours ago.  8/10 in severity on presentation to the ED.  CP resolved completely after multiple NTG doses (although this has given her a headache instead she says).  Risk factors include DM and HTN.  Review of Systems: Systems reviewed.  As above, otherwise negative  Past Medical History  Diagnosis Date  . Diabetes mellitus   . Hypertension    Past Surgical History  Procedure Laterality Date  . Total hip arthroplasty    . Cholecystectomy  10/15/11   Social History:  reports that she has been passively smoking.  She has never used smokeless tobacco. She reports that she does not drink alcohol or use illicit drugs.  Allergies  Allergen Reactions  . Adhesive [Tape] Other (See Comments)    Pt states it removes her skin  . Latex Itching  . Morphine And Related     Sweaty, suppresses breathing    History reviewed. No pertinent family history.   Prior to Admission medications   Medication Sig Start Date End Date Taking? Authorizing Provider  furosemide (LASIX) 20 MG tablet Take 1 tablet (20 mg total) by mouth daily. 05/21/15   Rolland Porter, MD  HYDROcodone-acetaminophen (NORCO/VICODIN) 5-325 MG per tablet Take 1-2 tablets by mouth every 4 (four) hours as needed. 05/21/15   Rolland Porter, MD  losartan (COZAAR) 25 MG tablet Take 25 mg by mouth daily.      Historical Provider, MD  metFORMIN (GLUCOPHAGE) 500 MG tablet Take 500 mg by mouth 2 (two) times daily with a meal.      Historical Provider, MD  methocarbamol (ROBAXIN) 500 MG tablet Take 1 tablet (500 mg total) by mouth 2 (two) times daily. 08/27/13   Rolland Porter, MD  Multiple Vitamins-Minerals-FA (VITATAB MV) 2.8 MG  TABS Take 2 tablets by mouth daily.      Historical Provider, MD  omeprazole (PRILOSEC) 20 MG capsule  11/10/11   Historical Provider, MD  ONE TOUCH ULTRA TEST test strip  11/06/11   Historical Provider, MD  Dola Argyle LANCETS MISC  11/06/11   Historical Provider, MD   Physical Exam: Filed Vitals:   07/25/15 0517  BP: 127/69  Pulse: 64  Temp: 97.9 F (36.6 C)  Resp: 16    BP 127/69 mmHg  Pulse 64  Temp(Src) 97.9 F (36.6 C) (Oral)  Resp 16  Ht 5\' 5"  (1.651 m)  Wt 97.5 kg (214 lb 15.2 oz)  BMI 35.77 kg/m2  SpO2 99%  General Appearance:    Alert, oriented, no distress, appears stated age  Head:    Normocephalic, atraumatic  Eyes:    PERRL, EOMI, sclera non-icteric        Nose:   Nares without drainage or epistaxis. Mucosa, turbinates normal  Throat:   Moist mucous membranes. Oropharynx without erythema or exudate.  Neck:   Supple. No carotid bruits.  No thyromegaly.  No lymphadenopathy.   Back:     No CVA tenderness, no spinal tenderness  Lungs:     Clear to auscultation bilaterally, without wheezes, rhonchi or rales  Chest wall:    No tenderness to palpitation  Heart:    Regular rate and rhythm without murmurs, gallops,  rubs  Abdomen:     Soft, non-tender, nondistended, normal bowel sounds, no organomegaly  Genitalia:    deferred  Rectal:    deferred  Extremities:   No clubbing, cyanosis or edema.  Pulses:   2+ and symmetric all extremities  Skin:   Skin color, texture, turgor normal, no rashes or lesions  Lymph nodes:   Cervical, supraclavicular, and axillary nodes normal  Neurologic:   CNII-XII intact. Normal strength, sensation and reflexes      throughout    Labs on Admission:  Basic Metabolic Panel:  Recent Labs Lab 07/25/15 0026  NA 142  K 4.0  CL 105  GLUCOSE 106*  BUN 20  CREATININE 1.00   Liver Function Tests: No results for input(s): AST, ALT, ALKPHOS, BILITOT, PROT, ALBUMIN in the last 168 hours. No results for input(s): LIPASE, AMYLASE in  the last 168 hours. No results for input(s): AMMONIA in the last 168 hours. CBC:  Recent Labs Lab 07/25/15 0011 07/25/15 0026  WBC 5.9  --   NEUTROABS 2.2  --   HGB 13.7 15.6*  HCT 43.1 46.0  MCV 94.9  --   PLT 208  --    Cardiac Enzymes:  Recent Labs Lab 07/25/15 0011  TROPONINI <0.03    BNP (last 3 results) No results for input(s): PROBNP in the last 8760 hours. CBG:  Recent Labs Lab 07/25/15 0421  GLUCAP 105*    Radiological Exams on Admission: Dg Chest 2 View  07/25/2015   CLINICAL DATA:  Chest pain for 8 hr with diaphoresis.  EXAM: CHEST  2 VIEW  COMPARISON:  05/21/2015  FINDINGS: The cardiomediastinal contours are normal. Mild eventration of right hemidiaphragm and interstitial prominence, unchanged. Pulmonary vasculature is normal. No consolidation, pleural effusion, or pneumothorax. No acute osseous abnormalities are seen. Mild degenerative change in the mid thoracic spine.  IMPRESSION: Stable chronic change without acute pulmonary process.   Electronically Signed   By: Rubye Oaks M.D.   On: 07/25/2015 00:52    EKG: Independently reviewed.T wave inversions and ST depressions in inferior leads and lateral leads.  Assessment/Plan Active Problems:   Chest pain   DM2 (diabetes mellitus, type 2)   HTN (hypertension)   1. Chest pain - EKG shows ST segment depression and T wave inversions in inferior and lateral leads.  Somewhat atypical in presentation, does have 2 risk factors.  Overall her heart score is 7 by my calculation. 1. Chest pain obs pathway 2. Tele monitor 3. Serial trops 4. Calling cards for eval for possible stress test 2. DM2 - 1. Hold metformin 2. Low dose SSI q4h while NPO 3. HTN - continue home losartan    Code Status: Full  Family Communication: No family in room Disposition Plan: Admit to obs   Time spent: 70 min  Mariadelaluz Guggenheim M. [facility] Pager [phone]  If 7AM-7PM, please contact the day team taking care of  the patient [email or web address] Password [email or web address] 07/25/2015, 5:59 AM

## 2015-07-25 NOTE — Discharge Summary (Signed)
[facility]  Physician Discharge Summary   Patient ID: SANDIA PFUND MRN: [id number] DOB/AGE: 06-26-1941 74 y.o.  Admit date: 07/25/2015 Discharge date: 07/25/2015  PCP: Doreatha Martin, MD  DISCHARGE DIAGNOSES:  Active Problems:   Chest pain   DM2 (diabetes mellitus, type 2)   HTN (hypertension)   Pain in the chest   RECOMMENDATIONS FOR OUTPATIENT FOLLOW UP: 1. PCP to determine initiation of statin 2. Patient started on scheduled PPI.  DISCHARGE CONDITION: fair  Diet recommendation: Modified carbohydrate  Filed Weights   07/25/15 0008 07/25/15 0511  Weight: 95.255 kg (210 lb) 97.5 kg (214 lb 15.2 oz)    INITIAL HISTORY: 74 year old African-American female presented with complaints of chest tightness which started after she ate a meal yesterday afternoon. The pain persisted so she decided to come into the hospital. She was given nitroglycerin with which the pain resolved. She was also noted to have an abnormal EKG with diffuse T-wave inversions.  Consultations:  Cardiology  Procedures: Cardiac catheterization 8/1 Normal coronary arteries with normal systolic function  HOSPITAL COURSE:   Chest pain with abnormal EKG Her symptoms and presentation were more suspicious for a GI etiology. Abdomen was benign. However, due to her EKG abnormality. She was admitted to the hospital. She was seen by cardiology and underwent a cardiac catheterization. Normal coronary arteries were noted. Systolic function was normal. She will be discharged on twice a day dose of PPI for 2 weeks and then once a day. She will be asked to follow-up with her primary care physician regarding initiation of a statin. Her LDL is 96.  Other medical issues including hypertension and diabetes are stable. EKG was repeated this morning and there were no new changes.  She was cleared by cardiology for discharge. She was subsequently discharged home.    PERTINENT LABS:  The results of  significant diagnostics from this hospitalization (including imaging, microbiology, ancillary and laboratory) are listed below for reference.     Labs: Basic Metabolic Panel:  Recent Labs Lab 07/25/15 0026  NA 142  K 4.0  CL 105  GLUCOSE 106*  BUN 20  CREATININE 1.00   CBC:  Recent Labs Lab 07/25/15 0011 07/25/15 0026  WBC 5.9  --   NEUTROABS 2.2  --   HGB 13.7 15.6*  HCT 43.1 46.0  MCV 94.9  --   PLT 208  --    Cardiac Enzymes:  Recent Labs Lab 07/25/15 0011 07/25/15 0654  TROPONINI <0.03 <0.03    CBG:  Recent Labs Lab 07/25/15 0421 07/25/15 1154  GLUCAP 105* 94     IMAGING STUDIES Dg Chest 2 View  07/25/2015   CLINICAL DATA:  Chest pain for 8 hr with diaphoresis.  EXAM: CHEST  2 VIEW  COMPARISON:  05/21/2015  FINDINGS: The cardiomediastinal contours are normal. Mild eventration of right hemidiaphragm and interstitial prominence, unchanged. Pulmonary vasculature is normal. No consolidation, pleural effusion, or pneumothorax. No acute osseous abnormalities are seen. Mild degenerative change in the mid thoracic spine.  IMPRESSION: Stable chronic change without acute pulmonary process.   Electronically Signed   By: Rubye Oaks M.D.   On: 07/25/2015 00:52    DISCHARGE EXAMINATION: Filed Vitals:   07/25/15 1054 07/25/15 1059 07/25/15 1222 07/25/15 1232  BP: 123/73  107/57 118/58  Pulse: 79 0    Temp:      TempSrc:      Resp: 9 0    Height:      Weight:      SpO2:  99% 96%     General appearance: alert, cooperative, appears stated age and no distress Resp: clear to auscultation bilaterally Cardio: regular rate and rhythm, S1, S2 normal, no murmur, click, rub or gallop GI: soft, non-tender; bowel sounds normal; no masses,  no organomegaly   DISPOSITION: Home  Discharge Instructions    Call MD for:  difficulty breathing, headache or visual disturbances    Complete by:  As directed      Call MD for:  extreme fatigue    Complete by:  As directed        Call MD for:  persistant dizziness or light-headedness    Complete by:  As directed      Call MD for:  persistant nausea and vomiting    Complete by:  As directed      Call MD for:  severe uncontrolled pain    Complete by:  As directed      Call MD for:  temperature >100.4    Complete by:  As directed      Diet Carb Modified    Complete by:  As directed      Discharge instructions    Complete by:  As directed   You will need to discuss with your primary physician regarding being on a medication for cholesterol. Please resume metformin only after 2 days. Please follow-up with your primary care physician within one week.    You were cared for by a hospitalist during your hospital stay. If you have any questions about your discharge medications or the care you received while you were in the hospital after you are discharged, you can call the unit and asked to speak with the hospitalist on call if the hospitalist that took care of you is not available. Once you are discharged, your primary care physician will handle any further medical issues. Please note that NO REFILLS for any discharge medications will be authorized once you are discharged, as it is imperative that you return to your primary care physician (or establish a relationship with a primary care physician if you do not have one) for your aftercare needs so that they can reassess your need for medications and monitor your lab values. If you do not have a primary care physician, you can call [phone] for a physician referral.     Increase activity slowly    Complete by:  As directed            ALLERGIES:  Allergies  Allergen Reactions  . Adhesive [Tape] Other (See Comments)    Pt states it removes her skin  . Latex Itching  . Morphine And Related     Sweaty, suppresses breathing     Current Discharge Medication List    CONTINUE these medications which have CHANGED   Details  metFORMIN (GLUCOPHAGE) 500 MG tablet Take  1 tablet (500 mg total) by mouth 2 (two) times daily with a meal. Resume on August 3    omeprazole (PRILOSEC) 20 MG capsule 1 tablet twice daily for 2 weeks and then once daily Qty: 60 capsule, Refills: 2      CONTINUE these medications which have NOT CHANGED   Details  aspirin 81 MG chewable tablet Chew 81 mg by mouth daily.    Cholecalciferol (D 10000) 10000 UNITS CAPS Take 10,000 Units by mouth once a week. On Wednesdays    etodolac (LODINE) 400 MG tablet Take 400 mg by mouth 2 (two) times daily as needed. inflammation  HYDROcodone-acetaminophen (NORCO/VICODIN) 5-325 MG per tablet Take 1-2 tablets by mouth every 4 (four) hours as needed. Qty: 20 tablet, Refills: 0    losartan (COZAAR) 25 MG tablet Take 25 mg by mouth daily.      methocarbamol (ROBAXIN) 500 MG tablet Take 1 tablet (500 mg total) by mouth 2 (two) times daily. Qty: 20 tablet, Refills: 0    Multiple Vitamins-Minerals-FA (VITATAB MV) 2.8 MG TABS Take 2 tablets by mouth daily.         Follow-up Information    Follow up with [facility], MD. Schedule an appointment as soon as possible for a visit in 1 week.   Specialty:  Internal Medicine   Why:  post hospitalization follow up   Contact information:   [address] [place] [address] [phone]       Follow up with Dietrich Pates, MD. Schedule an appointment as soon as possible for a visit in 1 month.   Specialty:  Cardiology   Why:  post hospitalization follow up   Contact information:   [address] [place] [address] [phone]       TOTAL DISCHARGE TIME: 35 minutes  [facility]  [facility] Pager [phone]  07/25/2015, 4:04 PM

## 2015-07-25 NOTE — Discharge Instructions (Signed)
Gastroesophageal Reflux Disease, Adult Gastroesophageal reflux disease (GERD) happens when acid from your stomach flows up into the esophagus. When acid comes in contact with the esophagus, the acid causes soreness (inflammation) in the esophagus. Over time, GERD may create small holes (ulcers) in the lining of the esophagus. CAUSES   Increased body weight. This puts pressure on the stomach, making acid rise from the stomach into the esophagus.  Smoking. This increases acid production in the stomach.  Drinking alcohol. This causes decreased pressure in the lower esophageal sphincter (valve or ring of muscle between the esophagus and stomach), allowing acid from the stomach into the esophagus.  Late evening meals and a full stomach. This increases pressure and acid production in the stomach.  A malformed lower esophageal sphincter. Sometimes, no cause is found. SYMPTOMS   Burning pain in the lower part of the mid-chest behind the breastbone and in the mid-stomach area. This may occur twice a week or more often.  Trouble swallowing.  Sore throat.  Dry cough.  Asthma-like symptoms including chest tightness, shortness of breath, or wheezing. DIAGNOSIS  Your caregiver may be able to diagnose GERD based on your symptoms. In some cases, X-rays and other tests may be done to check for complications or to check the condition of your stomach and esophagus. TREATMENT  Your caregiver may recommend over-the-counter or prescription medicines to help decrease acid production. Ask your caregiver before starting or adding any new medicines.  HOME CARE INSTRUCTIONS   Change the factors that you can control. Ask your caregiver for guidance concerning weight loss, quitting smoking, and alcohol consumption.  Avoid foods and drinks that make your symptoms worse, such as:  Caffeine or alcoholic drinks.  Chocolate.  Peppermint or mint flavorings.  Garlic and onions.  Spicy foods.  Citrus fruits,  such as oranges, lemons, or limes.  Tomato-based foods such as sauce, chili, salsa, and pizza.  Fried and fatty foods.  Avoid lying down for the 3 hours prior to your bedtime or prior to taking a nap.  Eat small, frequent meals instead of large meals.  Wear loose-fitting clothing. Do not wear anything tight around your waist that causes pressure on your stomach.  Raise the head of your bed 6 to 8 inches with wood blocks to help you sleep. Extra pillows will not help.  Only take over-the-counter or prescription medicines for pain, discomfort, or fever as directed by your caregiver.  Do not take aspirin, ibuprofen, or other nonsteroidal anti-inflammatory drugs (NSAIDs). SEEK IMMEDIATE MEDICAL CARE IF:   You have pain in your arms, neck, jaw, teeth, or back.  Your pain increases or changes in intensity or duration.  You develop nausea, vomiting, or sweating (diaphoresis).  You develop shortness of breath, or you faint.  Your vomit is green, yellow, black, or looks like coffee grounds or blood.  Your stool is red, bloody, or black. These symptoms could be signs of other problems, such as heart disease, gastric bleeding, or esophageal bleeding. MAKE SURE YOU:   Understand these instructions.  Will watch your condition.  Will get help right away if you are not doing well or get worse. Document Released: 09/19/2005 Document Revised: 03/03/2012 Document Reviewed: 06/29/2011 [facility] Patient Information 2015 [place], [place]. This information is not intended to replace advice given to you by your health care provider. Make sure you discuss any questions you have with your health care provider.  Radial Site Care Refer to this sheet in the next few weeks. These instructions provide  you with information on caring for yourself after your procedure. Your caregiver may also give you more specific instructions. Your treatment has been planned according to current medical practices, but  problems sometimes occur. Call your caregiver if you have any problems or questions after your procedure. HOME CARE INSTRUCTIONS  You may shower the day after the procedure.Remove the bandage (dressing) and gently wash the site with plain soap and water.Gently pat the site dry.  Do not apply powder or lotion to the site.  Do not submerge the affected site in water for 3 to 5 days.  Inspect the site at least twice daily.  Do not flex or bend the affected arm for 24 hours.  No lifting over 5 pounds (2.3 kg) for 5 days after your procedure.  Do not drive home if you are discharged the same day of the procedure. Have someone else drive you.  You may drive 24 hours after the procedure unless otherwise instructed by your caregiver.  Do not operate machinery or power tools for 24 hours.  A responsible adult should be with you for the first 24 hours after you arrive home. What to expect:  Any bruising will usually fade within 1 to 2 weeks.  Blood that collects in the tissue (hematoma) may be painful to the touch. It should usually decrease in size and tenderness within 1 to 2 weeks. SEEK IMMEDIATE MEDICAL CARE IF:  You have unusual pain at the radial site.  You have redness, warmth, swelling, or pain at the radial site.  You have drainage (other than a small amount of blood on the dressing).  You have chills.  You have a fever or persistent symptoms for more than 72 hours.  You have a fever and your symptoms suddenly get worse.  Your arm becomes pale, cool, tingly, or numb.  You have heavy bleeding from the site. Hold pressure on the site. Document Released: 01/12/2011 Document Revised: 03/03/2012 Document Reviewed: 01/12/2011 [facility] Patient Information 2015 [place], [place]. This information is not intended to replace advice given to you by your health care provider. Make sure you discuss any questions you have with your health care provider.

## 2015-07-25 NOTE — ED Notes (Signed)
Back from xray, med orders received and initiated.

## 2015-07-25 NOTE — Progress Notes (Signed)
Results of cath noted   CP appears to be noncardiac I have forwarded report to primary MD Should be on a statin  Lipids can be checked as outpt with follow up there.   OK to d/c from cardiac standpoint.

## 2015-07-25 NOTE — Interval H&P Note (Signed)
Cath Lab Visit (complete for each Cath Lab visit)  Clinical Evaluation Leading to the Procedure:   ACS: Yes.    Non-ACS:    Anginal Classification: CCS IV  Anti-ischemic medical therapy: No Therapy  Non-Invasive Test Results: No non-invasive testing performed  Prior CABG: No previous CABG      History and Physical Interval Note:  07/25/2015 10:28 AM  [name]  has presented today for surgery, with the diagnosis of cp  The various methods of treatment have been discussed with the patient and family. After consideration of risks, benefits and other options for treatment, the patient has consented to  Procedure(s): Left Heart Cath and Coronary Angiography (N/A) as a surgical intervention .  [name] history has been reviewed, patient examined, no change in status, stable for surgery.  I have reviewed the patient's chart and labs.  Questions were answered to the patient's satisfaction.     Nanetta Batty

## 2015-07-25 NOTE — ED Notes (Signed)
Pt alert, NAD, calm, interactive, resps e/u, no dyspnea noted.

## 2015-07-25 NOTE — Consult Note (Signed)
Primary Physician: Primary Cardiologist:  Asked to see re CP    HPI:  Pt is a 74 yo who presented today with CP  Pain was pressure sensation.  Began yesterday. Pt said the spell began after dinner  She did not have a big dinner.  Was sitting on back porch when she developed a indigestion sensation, like had to burp but couldn't.  Mild SOB  SOme sweating on head.   Took omeprazole x 3  No help.  Drove to ER IN ER was given NTG  Discomfort went away   She has never had before  No other chest pains/discomfort.   Activity limitied by bursitis in hip  Hasnt done water aerobics in over 3 months.   No problmes swallowing things  No hx of GERD     Past Medical History  Diagnosis Date  . Diabetes mellitus   . Hypertension     Medications Prior to Admission  Medication Sig Dispense Refill  . furosemide (LASIX) 20 MG tablet Take 1 tablet (20 mg total) by mouth daily. 6 tablet 0  . HYDROcodone-acetaminophen (NORCO/VICODIN) 5-325 MG per tablet Take 1-2 tablets by mouth every 4 (four) hours as needed. 20 tablet 0  . losartan (COZAAR) 25 MG tablet Take 25 mg by mouth daily.      . metFORMIN (GLUCOPHAGE) 500 MG tablet Take 500 mg by mouth 2 (two) times daily with a meal.      . methocarbamol (ROBAXIN) 500 MG tablet Take 1 tablet (500 mg total) by mouth 2 (two) times daily. 20 tablet 0  . Multiple Vitamins-Minerals-FA (VITATAB MV) 2.8 MG TABS Take 2 tablets by mouth daily.      Marland Kitchen omeprazole (PRILOSEC) 20 MG capsule     . ONE TOUCH ULTRA TEST test strip     . ONETOUCH DELICA LANCETS MISC        . aspirin  81 mg Oral Daily  . heparin  5,000 Units Subcutaneous 3 times per day  . insulin aspart  0-9 Units Subcutaneous 6 times per day  . losartan  25 mg Oral Daily  . pantoprazole  40 mg Oral Daily    Infusions:    Allergies  Allergen Reactions  . Adhesive [Tape] Other (See Comments)    Pt states it removes her skin  . Latex Itching  . Morphine And Related     Sweaty, suppresses  breathing    History   Social History  . Marital Status: Widowed    Spouse Name: N/A  . Number of Children: N/A  . Years of Education: N/A   Occupational History  . Not on file.   Social History Main Topics  . Smoking status: Passive Smoke Exposure - Never Smoker  . Smokeless tobacco: Never Used  . Alcohol Use: No  . Drug Use: No  . Sexual Activity: Not on file   Other Topics Concern  . Not on file   Social History Narrative    History reviewed. No pertinent family history.  REVIEW OF SYSTEMS:  All systems reviewed  Negative to the above problem except as noted above.    PHYSICAL EXAM: Filed Vitals:   07/25/15 0752  BP: 109/56  Pulse: 78  Temp: 97.8 F (36.6 C)  Resp: 16     Intake/Output Summary (Last 24 hours) at 07/25/15 0757 Last data filed at 07/25/15 6962  Gross per 24 hour  Intake    120 ml  Output      0  ml  Net    120 ml    General:  Well appearing. No respiratory difficulty HEENT: normal Neck: supple. no JVD. Carotids 2+ bilat; no bruits. No lymphadenopathy or thryomegaly appreciated. Cor: PMI nondisplaced. Regular rate & rhythm. No rubs, gallops or murmurs. Lungs: clear Abdomen: soft, nontender, nondistended. No hepatosplenomegaly. No bruits or masses. Good bowel sounds. Extremities: no cyanosis, clubbing, rash, edema Neuro: alert & oriented x 3, cranial nerves grossly intact. moves all 4 extremities w/o difficulty. Affect pleasant.  ECG:  SR  Sl ST depression with T wave inversion I, II, AVF, V3 to V6  More prominent than on previous EKG   EKG this AM  T wave inversion inferior and laterally is more prominent    Results for orders placed or performed during the hospital encounter of 07/25/15 (from the past 24 hour(s))  CBC with Differential     Status: Abnormal   Collection Time: 07/25/15 12:11 AM  Result Value Ref Range   WBC 5.9 4.0 - 10.5 K/uL   RBC 4.54 3.87 - 5.11 MIL/uL   Hemoglobin 13.7 12.0 - 15.0 g/dL   HCT 16.1 09.6 - 04.5 %     MCV 94.9 78.0 - 100.0 fL   MCH 30.2 26.0 - 34.0 pg   MCHC 31.8 30.0 - 36.0 g/dL   RDW 40.9 81.1 - 91.4 %   Platelets 208 150 - 400 K/uL   Neutrophils Relative % 37 (L) 43 - 77 %   Neutro Abs 2.2 1.7 - 7.7 K/uL   Lymphocytes Relative 46 12 - 46 %   Lymphs Abs 2.7 0.7 - 4.0 K/uL   Monocytes Relative 10 3 - 12 %   Monocytes Absolute 0.6 0.1 - 1.0 K/uL   Eosinophils Relative 6 (H) 0 - 5 %   Eosinophils Absolute 0.4 0.0 - 0.7 K/uL   Basophils Relative 1 0 - 1 %   Basophils Absolute 0.1 0.0 - 0.1 K/uL  Troponin I     Status: None   Collection Time: 07/25/15 12:11 AM  Result Value Ref Range   Troponin I <0.03 <0.031 ng/mL  I-Stat Chem 8, ED     Status: Abnormal   Collection Time: 07/25/15 12:26 AM  Result Value Ref Range   Sodium 142 135 - 145 mmol/L   Potassium 4.0 3.5 - 5.1 mmol/L   Chloride 105 101 - 111 mmol/L   BUN 20 6 - 20 mg/dL   Creatinine, Ser 7.82 0.44 - 1.00 mg/dL   Glucose, Bld 956 (H) 65 - 99 mg/dL   Calcium, Ion 2.13 0.86 - 1.30 mmol/L   TCO2 24 0 - 100 mmol/L   Hemoglobin 15.6 (H) 12.0 - 15.0 g/dL   HCT 57.8 46.9 - 62.9 %  CBG monitoring, ED     Status: Abnormal   Collection Time: 07/25/15  4:21 AM  Result Value Ref Range   Glucose-Capillary 105 (H) 65 - 99 mg/dL   Dg Chest 2 View  04/24/8412   CLINICAL DATA:  Chest pain for 8 hr with diaphoresis.  EXAM: CHEST  2 VIEW  COMPARISON:  05/21/2015  FINDINGS: The cardiomediastinal contours are normal. Mild eventration of right hemidiaphragm and interstitial prominence, unchanged. Pulmonary vasculature is normal. No consolidation, pleural effusion, or pneumothorax. No acute osseous abnormalities are seen. Mild degenerative change in the mid thoracic spine.  IMPRESSION: Stable chronic change without acute pulmonary process.   Electronically Signed   By: Rubye Oaks M.D.   On: 07/25/2015 00:52  ASSESSMENT: Pt is a 74 yo with a history of HTN nad DM (x 4 years; glu under good control)  Presents with chest pressure   Indigestion  New to her   Pt is not that active.   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  WIll plan for later today  2.  HL  Check lipids  Pt needs to be on a statin.    3  HTN  Follow

## 2015-07-26 MED FILL — Heparin Sodium (Porcine) 2 Unit/ML in Sodium Chloride 0.9%: INTRAMUSCULAR | Qty: 1000 | Status: AC

## 2015-07-26 MED FILL — Lidocaine HCl Local Preservative Free (PF) Inj 1%: INTRAMUSCULAR | Qty: 30 | Status: AC

## 2015-07-26 MED FILL — Nitroglycerin IV Soln 100 MCG/ML in D5W: INTRA_ARTERIAL | Qty: 10 | Status: AC

## 2015-08-04 DIAGNOSIS — K219 Gastro-esophageal reflux disease without esophagitis: Secondary | ICD-10-CM | POA: Insufficient documentation

## 2015-08-09 ENCOUNTER — Encounter (HOSPITAL_COMMUNITY): Payer: Self-pay | Admitting: Anesthesiology

## 2015-08-09 ENCOUNTER — Other Ambulatory Visit: Payer: Self-pay | Admitting: Orthopedic Surgery

## 2015-08-09 ENCOUNTER — Inpatient Hospital Stay (HOSPITAL_COMMUNITY): Payer: Medicare Other

## 2015-08-09 ENCOUNTER — Inpatient Hospital Stay (HOSPITAL_COMMUNITY)
Admission: AD | Admit: 2015-08-09 | Discharge: 2015-08-13 | DRG: 467 | Disposition: A | Discharge status: Home Health | Payer: Medicare Other | Source: Ambulatory Visit | Attending: Orthopedic Surgery | Admitting: Orthopedic Surgery

## 2015-08-09 DIAGNOSIS — I10 Essential (primary) hypertension: Secondary | ICD-10-CM | POA: Diagnosis present

## 2015-08-09 DIAGNOSIS — D62 Acute posthemorrhagic anemia: Secondary | ICD-10-CM

## 2015-08-09 DIAGNOSIS — M25552 Pain in left hip: Secondary | ICD-10-CM | POA: Diagnosis present

## 2015-08-09 DIAGNOSIS — Z96649 Presence of unspecified artificial hip joint: Secondary | ICD-10-CM

## 2015-08-09 DIAGNOSIS — Z7722 Contact with and (suspected) exposure to environmental tobacco smoke (acute) (chronic): Secondary | ICD-10-CM | POA: Diagnosis present

## 2015-08-09 DIAGNOSIS — Z91048 Other nonmedicinal substance allergy status: Secondary | ICD-10-CM

## 2015-08-09 DIAGNOSIS — E119 Type 2 diabetes mellitus without complications: Secondary | ICD-10-CM | POA: Diagnosis present

## 2015-08-09 DIAGNOSIS — Z885 Allergy status to narcotic agent status: Secondary | ICD-10-CM | POA: Diagnosis not present

## 2015-08-09 DIAGNOSIS — T84018A Broken internal joint prosthesis, other site, initial encounter: Secondary | ICD-10-CM

## 2015-08-09 DIAGNOSIS — Y838 Other surgical procedures as the cause of abnormal reaction of the patient, or of later complication, without mention of misadventure at the time of the procedure: Secondary | ICD-10-CM | POA: Diagnosis present

## 2015-08-09 DIAGNOSIS — T84091A Other mechanical complication of internal left hip prosthesis, initial encounter: Secondary | ICD-10-CM | POA: Diagnosis present

## 2015-08-09 DIAGNOSIS — Z91040 Latex allergy status: Secondary | ICD-10-CM | POA: Diagnosis not present

## 2015-08-09 DIAGNOSIS — T84031A Mechanical loosening of internal left hip prosthetic joint, initial encounter: Secondary | ICD-10-CM | POA: Diagnosis present

## 2015-08-09 DIAGNOSIS — Z01811 Encounter for preprocedural respiratory examination: Secondary | ICD-10-CM

## 2015-08-09 HISTORY — DX: Unspecified osteoarthritis, unspecified site: M19.90

## 2015-08-09 LAB — URINALYSIS, ROUTINE W REFLEX MICROSCOPIC
GLUCOSE, UA: NEGATIVE mg/dL
HGB URINE DIPSTICK: NEGATIVE
Ketones, ur: NEGATIVE mg/dL
Leukocytes, UA: NEGATIVE
Nitrite: NEGATIVE
PROTEIN: NEGATIVE mg/dL
Specific Gravity, Urine: 1.014 (ref 1.005–1.030)
UROBILINOGEN UA: 0.2 mg/dL (ref 0.0–1.0)
pH: 6.5 (ref 5.0–8.0)

## 2015-08-09 LAB — COMPREHENSIVE METABOLIC PANEL
ALT: 15 U/L (ref 14–54)
AST: 22 U/L (ref 15–41)
Albumin: 3.7 g/dL (ref 3.5–5.0)
Alkaline Phosphatase: 141 U/L — ABNORMAL HIGH (ref 38–126)
Anion gap: 8 (ref 5–15)
BUN: 11 mg/dL (ref 6–20)
CHLORIDE: 106 mmol/L (ref 101–111)
CO2: 29 mmol/L (ref 22–32)
Calcium: 9.9 mg/dL (ref 8.9–10.3)
Creatinine, Ser: 0.8 mg/dL (ref 0.44–1.00)
Glucose, Bld: 144 mg/dL — ABNORMAL HIGH (ref 65–99)
POTASSIUM: 3.9 mmol/L (ref 3.5–5.1)
SODIUM: 143 mmol/L (ref 135–145)
Total Bilirubin: 0.5 mg/dL (ref 0.3–1.2)
Total Protein: 7 g/dL (ref 6.5–8.1)

## 2015-08-09 LAB — CBC WITH DIFFERENTIAL/PLATELET
BASOS ABS: 0 10*3/uL (ref 0.0–0.1)
Basophils Relative: 0 % (ref 0–1)
EOS ABS: 0.2 10*3/uL (ref 0.0–0.7)
EOS PCT: 4 % (ref 0–5)
HCT: 44.5 % (ref 36.0–46.0)
Hemoglobin: 14.3 g/dL (ref 12.0–15.0)
LYMPHS PCT: 33 % (ref 12–46)
Lymphs Abs: 1.9 10*3/uL (ref 0.7–4.0)
MCH: 30.4 pg (ref 26.0–34.0)
MCHC: 32.1 g/dL (ref 30.0–36.0)
MCV: 94.5 fL (ref 78.0–100.0)
MONO ABS: 0.4 10*3/uL (ref 0.1–1.0)
Monocytes Relative: 7 % (ref 3–12)
Neutro Abs: 3.3 10*3/uL (ref 1.7–7.7)
Neutrophils Relative %: 56 % (ref 43–77)
PLATELETS: 221 10*3/uL (ref 150–400)
RBC: 4.71 MIL/uL (ref 3.87–5.11)
RDW: 14.6 % (ref 11.5–15.5)
WBC: 5.9 10*3/uL (ref 4.0–10.5)

## 2015-08-09 LAB — GLUCOSE, CAPILLARY
Glucose-Capillary: 160 mg/dL — ABNORMAL HIGH (ref 65–99)
Glucose-Capillary: 95 mg/dL (ref 65–99)

## 2015-08-09 LAB — SURGICAL PCR SCREEN
MRSA, PCR: NEGATIVE
Staphylococcus aureus: NEGATIVE

## 2015-08-09 LAB — PROTIME-INR
INR: 1.09 (ref 0.00–1.49)
PROTHROMBIN TIME: 14.3 s (ref 11.6–15.2)

## 2015-08-09 MED ORDER — METFORMIN HCL 500 MG PO TABS
500.0000 mg | ORAL_TABLET | Freq: Two times a day (BID) | ORAL | Status: DC
  Administered 2015-08-10: 500 mg via ORAL
  Filled 2015-08-09: qty 1

## 2015-08-09 MED ORDER — HYDROMORPHONE HCL 1 MG/ML IJ SOLN: 0.5000 mg | INTRAMUSCULAR | Status: DC | PRN

## 2015-08-09 MED ORDER — LOSARTAN POTASSIUM 25 MG PO TABS
25.0000 mg | ORAL_TABLET | Freq: Every day | ORAL | Status: DC
  Administered 2015-08-10: 25 mg via ORAL
  Filled 2015-08-09: qty 1

## 2015-08-09 MED ORDER — SODIUM CHLORIDE 0.9 % IV SOLN
INTRAVENOUS | Status: DC
  Administered 2015-08-09: 17:00:00 via INTRAVENOUS

## 2015-08-09 MED ORDER — OXYCODONE HCL 5 MG PO TABS
5.0000 mg | ORAL_TABLET | ORAL | Status: DC | PRN
  Administered 2015-08-09 – 2015-08-10 (×4): 10 mg via ORAL
  Filled 2015-08-09 (×5): qty 2

## 2015-08-09 NOTE — H&P (Signed)
TOTAL HIP REVISION ADMISSION H&P  Patient is admitted for left revision total hip arthroplasty.  Subjective:  Chief Complaint: left hip pain  HPI: Kara Montoya, 74 y.o. female, has a history of pain and functional disability in the left hip due to arthritis and patient has failed non-surgical conservative treatments for greater than 12 weeks to include NSAID's and/or analgesics and use of assistive devices. The indications for the revision total hip arthroplasty are fracture or mechanical failure of one or more component.  Onset of symptoms was gradual starting 1 years ago with rapidlly worsening course since that time.  Prior procedures on the left hip include arthroplasty.  Patient currently rates pain in the left hip at 10 out of 10 with activity.  There is worsening of pain with activity and weight bearing. Patient has evidence of prosthetic loosening by imaging studies.  This condition presents safety issues increasing the risk of falls.   There is no current active infection.  Patient Active Problem List   Diagnosis Date Noted  . 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 Chest pain 07/25/2015  . DM2 (diabetes mellitus, type 2) 07/25/2015  . HTN (hypertension) 07/25/2015  . Pain in the chest    Past Medical History  Diagnosis Date  . Diabetes mellitus   . Hypertension     Past Surgical History  Procedure Laterality Date  . Total hip arthroplasty    . Cholecystectomy  10/15/11  . Cardiac catheterization N/A 07/25/2015    Procedure: Left Heart Cath and Coronary Angiography;  Surgeon: Runell Gess, MD;  Location: [facility] INVASIVE CV LAB;  Service: Cardiovascular;  Laterality: N/A;    No prescriptions prior to admission   Allergies  Allergen Reactions  . Adhesive [Tape] Other (See Comments)    Pt states it removes her skin  . Latex Itching  . Morphine And Related     Sweaty, suppresses breathing    Social History  Substance Use Topics  .  Smoking status: Passive Smoke Exposure - Never Smoker  . Smokeless tobacco: Never Used  . Alcohol Use: No    No family history on file.    Review of Systems  All other systems reviewed and are negative.     Objective:  Physical Exam  Constitutional: She appears well-developed and well-nourished. She appears distressed.  HENT:  Head: Normocephalic and atraumatic.  Eyes: Pupils are equal, round, and reactive to light.  Neck: Normal range of motion. Neck supple.  Cardiovascular: Normal rate.   Respiratory: Breath sounds normal.  GI: Soft. Bowel sounds are normal.  Musculoskeletal: She exhibits tenderness.  SHE HAS SEVERE PAIN WITH INTERNAL AND EXTERNAL ROTATION OF HER LEFT HIP   There is no significant swelling. Distal pulses are 1+.  She has active ankle plantar and dorsiflexion.   Left hip exam: She has inability to weight-bear on her left hip.    Vital signs in last 24 hours:     Labs:   Estimated body mass index is 35.77 kg/(m^2) as calculated from the following:   Height as of 07/25/15: 5\' 5"  (1.651 m).   Weight as of 07/25/15: 97.5 kg (214 lb 15.2 oz).  Imaging Review:  Plain radiographs demonstrate  There is evidence of loosening of the acetabular cup.The bone quality appears to be adequate for age and reported activity level. 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 Revision total hip arthroplasty is deemed medically necessary. The treatment options including medical management, injection therapy, arthroscopy and arthroplasty were discussed at length. The risks and benefits of total hip arthroplasty were presented and  reviewed. The risks due to aseptic loosening, infection, stiffness, dislocation/subluxation,  thromboembolic complications and other imponderables were discussed.  The patient acknowledged the explanation, agreed to proceed with the plan and consent was signed. Patient is being admitted for inpatient treatment for surgery, pain control, PT, OT, prophylactic antibiotics, VTE prophylaxis, progressive ambulation and ADL's and discharge planning. The patient is planning to be discharged home with home health services.  The patient is admitted to the hospital the night prior to surgery due to unrelenting left hip pain and inability to   Weight-bear.  She is at risk for falling if she were at home.  This could lead to significant  Fracture around the left hip prosthesis.

## 2015-08-10 ENCOUNTER — Inpatient Hospital Stay (HOSPITAL_COMMUNITY): Payer: Medicare Other | Admitting: Anesthesiology

## 2015-08-10 ENCOUNTER — Inpatient Hospital Stay (HOSPITAL_COMMUNITY): Payer: Medicare Other

## 2015-08-10 ENCOUNTER — Encounter (HOSPITAL_COMMUNITY)
Admission: AD | Disposition: A | Discharge status: Home Health | Payer: Self-pay | Source: Ambulatory Visit | Attending: Orthopedic Surgery

## 2015-08-10 DIAGNOSIS — Z96649 Presence of unspecified artificial hip joint: Secondary | ICD-10-CM

## 2015-08-10 DIAGNOSIS — T84091A Other mechanical complication of internal left hip prosthesis, initial encounter: Secondary | ICD-10-CM | POA: Diagnosis present

## 2015-08-10 DIAGNOSIS — T84018A Broken internal joint prosthesis, other site, initial encounter: Secondary | ICD-10-CM

## 2015-08-10 HISTORY — PX: TOTAL HIP REVISION: SHX763

## 2015-08-10 HISTORY — PX: REVISION TOTAL HIP ARTHROPLASTY: SHX766

## 2015-08-10 LAB — GRAM STAIN

## 2015-08-10 LAB — GLUCOSE, CAPILLARY
GLUCOSE-CAPILLARY: 102 mg/dL — AB (ref 65–99)
GLUCOSE-CAPILLARY: 106 mg/dL — AB (ref 65–99)
GLUCOSE-CAPILLARY: 95 mg/dL (ref 65–99)
Glucose-Capillary: 130 mg/dL — ABNORMAL HIGH (ref 65–99)

## 2015-08-10 SURGERY — TOTAL HIP REVISION
Anesthesia: General | Site: Hip | Laterality: Left

## 2015-08-10 MED ORDER — LIDOCAINE HCL (CARDIAC) 20 MG/ML IV SOLN
INTRAVENOUS | Status: DC | PRN
  Administered 2015-08-10: 50 mg via INTRAVENOUS

## 2015-08-10 MED ORDER — ROCURONIUM BROMIDE 100 MG/10ML IV SOLN
INTRAVENOUS | Status: DC | PRN
  Administered 2015-08-10: 10 mg via INTRAVENOUS
  Administered 2015-08-10: 40 mg via INTRAVENOUS

## 2015-08-10 MED ORDER — METOCLOPRAMIDE HCL 5 MG PO TABS: 5.0000 mg | ORAL_TABLET | Freq: Three times a day (TID) | ORAL | Status: DC | PRN

## 2015-08-10 MED ORDER — BUPIVACAINE-EPINEPHRINE (PF) 0.25% -1:200000 IJ SOLN
INTRAMUSCULAR | Status: AC
  Filled 2015-08-10: qty 30

## 2015-08-10 MED ORDER — PHENYLEPHRINE 40 MCG/ML (10ML) SYRINGE FOR IV PUSH (FOR BLOOD PRESSURE SUPPORT)
PREFILLED_SYRINGE | INTRAVENOUS | Status: AC
  Filled 2015-08-10: qty 10

## 2015-08-10 MED ORDER — OXYCODONE HCL 5 MG PO TABS
ORAL_TABLET | ORAL | Status: AC
  Filled 2015-08-10: qty 1

## 2015-08-10 MED ORDER — ONDANSETRON HCL 4 MG/2ML IJ SOLN
INTRAMUSCULAR | Status: AC
  Filled 2015-08-10: qty 2

## 2015-08-10 MED ORDER — PROPOFOL 10 MG/ML IV BOLUS
INTRAVENOUS | Status: DC | PRN
Start: 2015-08-10 — End: 2015-08-10
  Administered 2015-08-10: 150 mg via INTRAVENOUS

## 2015-08-10 MED ORDER — LACTATED RINGERS IV SOLN
INTRAVENOUS | Status: DC | PRN
  Administered 2015-08-10 (×2): via INTRAVENOUS

## 2015-08-10 MED ORDER — ONDANSETRON HCL 4 MG PO TABS
4.0000 mg | ORAL_TABLET | Freq: Four times a day (QID) | ORAL | Status: DC | PRN
Start: 2015-08-10 — End: 2015-08-13
  Administered 2015-08-13: 4 mg via ORAL
  Filled 2015-08-10: qty 1

## 2015-08-10 MED ORDER — ACETAMINOPHEN 650 MG RE SUPP: 650.0000 mg | Freq: Four times a day (QID) | RECTAL | Status: DC | PRN

## 2015-08-10 MED ORDER — LOSARTAN POTASSIUM 25 MG PO TABS: 25.0000 mg | ORAL_TABLET | Freq: Every day | ORAL | Status: DC

## 2015-08-10 MED ORDER — FENTANYL CITRATE (PF) 100 MCG/2ML IJ SOLN
25.0000 ug | INTRAMUSCULAR | Status: DC | PRN
  Administered 2015-08-10 (×3): 50 ug via INTRAVENOUS

## 2015-08-10 MED ORDER — FENTANYL CITRATE (PF) 100 MCG/2ML IJ SOLN
INTRAMUSCULAR | Status: DC | PRN
  Administered 2015-08-10 (×2): 100 ug via INTRAVENOUS
  Administered 2015-08-10: 50 ug via INTRAVENOUS

## 2015-08-10 MED ORDER — ASPIRIN EC 325 MG PO TBEC
325.0000 mg | DELAYED_RELEASE_TABLET | Freq: Every day | ORAL | Status: DC
  Administered 2015-08-11 – 2015-08-13 (×3): 325 mg via ORAL
  Filled 2015-08-10 (×3): qty 1

## 2015-08-10 MED ORDER — FENTANYL CITRATE (PF) 100 MCG/2ML IJ SOLN
INTRAMUSCULAR | Status: AC
  Filled 2015-08-10: qty 2

## 2015-08-10 MED ORDER — BUPIVACAINE-EPINEPHRINE 0.5% -1:200000 IJ SOLN
INTRAMUSCULAR | Status: DC | PRN
  Administered 2015-08-10: 10 mL

## 2015-08-10 MED ORDER — GLYCOPYRROLATE 0.2 MG/ML IJ SOLN
INTRAMUSCULAR | Status: AC
  Filled 2015-08-10: qty 3

## 2015-08-10 MED ORDER — METHOCARBAMOL 500 MG PO TABS
500.0000 mg | ORAL_TABLET | Freq: Four times a day (QID) | ORAL | Status: DC | PRN
  Administered 2015-08-12 – 2015-08-13 (×3): 500 mg via ORAL
  Filled 2015-08-10 (×4): qty 1

## 2015-08-10 MED ORDER — PROPOFOL 10 MG/ML IV BOLUS
INTRAVENOUS | Status: AC
  Filled 2015-08-10: qty 20

## 2015-08-10 MED ORDER — PHENYLEPHRINE HCL 10 MG/ML IJ SOLN
INTRAMUSCULAR | Status: DC | PRN
  Administered 2015-08-10 (×2): 80 ug via INTRAVENOUS

## 2015-08-10 MED ORDER — PHENOL 1.4 % MT LIQD
1.0000 | OROMUCOSAL | Status: DC | PRN
Start: 2015-08-10 — End: 2015-08-13

## 2015-08-10 MED ORDER — HYDROCODONE-ACETAMINOPHEN 5-325 MG PO TABS: 1.0000 | ORAL_TABLET | ORAL | Status: DC | PRN

## 2015-08-10 MED ORDER — ACETAMINOPHEN 325 MG PO TABS
650.0000 mg | ORAL_TABLET | Freq: Four times a day (QID) | ORAL | Status: DC | PRN
Start: 2015-08-10 — End: 2015-08-13

## 2015-08-10 MED ORDER — ONDANSETRON HCL 4 MG/2ML IJ SOLN
INTRAMUSCULAR | Status: DC | PRN
  Administered 2015-08-10: 4 mg via INTRAVENOUS

## 2015-08-10 MED ORDER — BUPIVACAINE-EPINEPHRINE (PF) 0.5% -1:200000 IJ SOLN
INTRAMUSCULAR | Status: AC
  Filled 2015-08-10: qty 30

## 2015-08-10 MED ORDER — TRANEXAMIC ACID 1000 MG/10ML IV SOLN
1000.0000 mg | INTRAVENOUS | Status: AC
  Administered 2015-08-10: 1000 mg via INTRAVENOUS
  Filled 2015-08-10: qty 10

## 2015-08-10 MED ORDER — LACTATED RINGERS IV SOLN
INTRAVENOUS | Status: DC
  Administered 2015-08-10: 13:00:00 via INTRAVENOUS
  Administered 2015-08-10: 1000 mL via INTRAVENOUS

## 2015-08-10 MED ORDER — METFORMIN HCL 500 MG PO TABS
500.0000 mg | ORAL_TABLET | Freq: Two times a day (BID) | ORAL | Status: DC
  Administered 2015-08-10 – 2015-08-13 (×6): 500 mg via ORAL
  Filled 2015-08-10 (×6): qty 1

## 2015-08-10 MED ORDER — EPHEDRINE SULFATE 50 MG/ML IJ SOLN
INTRAMUSCULAR | Status: AC
  Filled 2015-08-10: qty 1

## 2015-08-10 MED ORDER — SODIUM CHLORIDE 0.9 % IR SOLN
Status: DC | PRN
  Administered 2015-08-10: 1000 mL

## 2015-08-10 MED ORDER — MENTHOL 3 MG MT LOZG
1.0000 | LOZENGE | OROMUCOSAL | Status: DC | PRN
Start: 2015-08-10 — End: 2015-08-13

## 2015-08-10 MED ORDER — ASPIRIN EC 325 MG PO TBEC: 325.0000 mg | DELAYED_RELEASE_TABLET | Freq: Two times a day (BID) | ORAL | Status: AC

## 2015-08-10 MED ORDER — ALBUMIN HUMAN 5 % IV SOLN
INTRAVENOUS | Status: DC | PRN
  Administered 2015-08-10: 15:00:00 via INTRAVENOUS

## 2015-08-10 MED ORDER — ALUMINUM HYDROXIDE GEL 320 MG/5ML PO SUSP
15.0000 mL | ORAL | Status: DC | PRN
  Filled 2015-08-10: qty 30

## 2015-08-10 MED ORDER — LOSARTAN POTASSIUM 25 MG PO TABS
25.0000 mg | ORAL_TABLET | Freq: Every day | ORAL | Status: DC
  Administered 2015-08-11 – 2015-08-13 (×3): 25 mg via ORAL
  Filled 2015-08-10 (×3): qty 1

## 2015-08-10 MED ORDER — NEOSTIGMINE METHYLSULFATE 10 MG/10ML IV SOLN
INTRAVENOUS | Status: DC | PRN
  Administered 2015-08-10: 4 mg via INTRAVENOUS

## 2015-08-10 MED ORDER — KCL IN DEXTROSE-NACL 20-5-0.45 MEQ/L-%-% IV SOLN
INTRAVENOUS | Status: DC
  Administered 2015-08-10 – 2015-08-11 (×2): via INTRAVENOUS
  Filled 2015-08-10 (×2): qty 1000

## 2015-08-10 MED ORDER — CEFAZOLIN SODIUM-DEXTROSE 2-3 GM-% IV SOLR
INTRAVENOUS | Status: AC
  Filled 2015-08-10: qty 50

## 2015-08-10 MED ORDER — STERILE WATER FOR INJECTION IJ SOLN
INTRAMUSCULAR | Status: AC
  Filled 2015-08-10: qty 10

## 2015-08-10 MED ORDER — NEOSTIGMINE METHYLSULFATE 10 MG/10ML IV SOLN
INTRAVENOUS | Status: AC
  Filled 2015-08-10: qty 1

## 2015-08-10 MED ORDER — CEFAZOLIN SODIUM-DEXTROSE 2-3 GM-% IV SOLR
INTRAVENOUS | Status: DC | PRN
  Administered 2015-08-10: 2 g via INTRAVENOUS

## 2015-08-10 MED ORDER — ROCURONIUM BROMIDE 50 MG/5ML IV SOLN
INTRAVENOUS | Status: AC
  Filled 2015-08-10: qty 1

## 2015-08-10 MED ORDER — OXYCODONE HCL 5 MG PO TABS
5.0000 mg | ORAL_TABLET | ORAL | Status: DC | PRN
  Administered 2015-08-10 (×2): 10 mg via ORAL
  Administered 2015-08-10 (×2): 5 mg via ORAL
  Administered 2015-08-11 – 2015-08-13 (×11): 10 mg via ORAL
  Filled 2015-08-10 (×13): qty 2

## 2015-08-10 MED ORDER — GLYCOPYRROLATE 0.2 MG/ML IJ SOLN
INTRAMUSCULAR | Status: DC | PRN
  Administered 2015-08-10: .7 mg via INTRAVENOUS

## 2015-08-10 MED ORDER — METOCLOPRAMIDE HCL 5 MG/ML IJ SOLN: 5.0000 mg | Freq: Three times a day (TID) | INTRAMUSCULAR | Status: DC | PRN

## 2015-08-10 MED ORDER — DIPHENHYDRAMINE HCL 12.5 MG/5ML PO ELIX: 12.5000 mg | ORAL_SOLUTION | ORAL | Status: DC | PRN

## 2015-08-10 MED ORDER — LIDOCAINE HCL (CARDIAC) 20 MG/ML IV SOLN
INTRAVENOUS | Status: AC
  Filled 2015-08-10: qty 5

## 2015-08-10 MED ORDER — DEXAMETHASONE SODIUM PHOSPHATE 10 MG/ML IJ SOLN
10.0000 mg | Freq: Once | INTRAMUSCULAR | Status: AC
  Administered 2015-08-11: 10 mg via INTRAVENOUS
  Filled 2015-08-10: qty 1

## 2015-08-10 MED ORDER — ONDANSETRON HCL 4 MG/2ML IJ SOLN
4.0000 mg | Freq: Four times a day (QID) | INTRAMUSCULAR | Status: DC | PRN
Start: 2015-08-10 — End: 2015-08-13
  Administered 2015-08-11: 4 mg via INTRAVENOUS
  Filled 2015-08-10: qty 2

## 2015-08-10 MED ORDER — PANTOPRAZOLE SODIUM 40 MG PO TBEC
40.0000 mg | DELAYED_RELEASE_TABLET | Freq: Every day | ORAL | Status: DC
  Administered 2015-08-11 – 2015-08-13 (×3): 40 mg via ORAL
  Filled 2015-08-10 (×3): qty 1

## 2015-08-10 MED ORDER — DEXTROSE 5 % IV SOLN
500.0000 mg | Freq: Four times a day (QID) | INTRAVENOUS | Status: DC | PRN
Start: 2015-08-10 — End: 2015-08-13
  Administered 2015-08-10: 500 mg via INTRAVENOUS
  Filled 2015-08-10 (×2): qty 5

## 2015-08-10 MED ORDER — TIZANIDINE HCL 2 MG PO TABS: 2.0000 mg | ORAL_TABLET | Freq: Three times a day (TID) | ORAL | Status: AC | PRN

## 2015-08-10 MED ORDER — HYDROMORPHONE HCL 1 MG/ML IJ SOLN
1.0000 mg | INTRAMUSCULAR | Status: DC | PRN
  Administered 2015-08-10 (×2): 1 mg via INTRAVENOUS
  Filled 2015-08-10 (×2): qty 1

## 2015-08-10 MED ORDER — FENTANYL CITRATE (PF) 250 MCG/5ML IJ SOLN
INTRAMUSCULAR | Status: AC
  Filled 2015-08-10: qty 5

## 2015-08-10 SURGICAL SUPPLY — 73 items
BLADE SAW SAG 73X25 THK (BLADE) ×2
BLADE SAW SGTL 73X25 THK (BLADE) IMPLANT
BRUSH FEMORAL CANAL (MISCELLANEOUS) IMPLANT
CANISTER SUCTION WELLS/JOHNSON (MISCELLANEOUS) ×2 IMPLANT
COVER SURGICAL LIGHT HANDLE (MISCELLANEOUS) ×3 IMPLANT
CUP SECTOR GRIPTON 58MM (Orthopedic Implant) ×2 IMPLANT
DRAPE C-ARM 42X72 X-RAY (DRAPES) IMPLANT
DRAPE IMP U-DRAPE 54X76 (DRAPES) ×3 IMPLANT
DRAPE ORTHO SPLIT 77X108 STRL (DRAPES) ×6
DRAPE PROXIMA HALF (DRAPES) ×3 IMPLANT
DRAPE SURG ORHT 6 SPLT 77X108 (DRAPES) ×1 IMPLANT
DRAPE U-SHAPE 47X51 STRL (DRAPES) ×3 IMPLANT
DRILL BIT 7/64X5 (BIT) ×3 IMPLANT
DRSG AQUACEL AG ADV 3.5X10 (GAUZE/BANDAGES/DRESSINGS) ×1 IMPLANT
DRSG AQUACEL AG ADV 3.5X14 (GAUZE/BANDAGES/DRESSINGS) ×2 IMPLANT
DURAPREP 26ML APPLICATOR (WOUND CARE) ×3 IMPLANT
ELECT BLADE 4.0 EZ CLEAN MEGAD (MISCELLANEOUS) ×3
ELECT BLADE 6.5 EXT (BLADE) IMPLANT
ELECT REM PT RETURN 9FT ADLT (ELECTROSURGICAL) ×3
ELECTRODE BLDE 4.0 EZ CLN MEGD (MISCELLANEOUS) IMPLANT
ELECTRODE REM PT RTRN 9FT ADLT (ELECTROSURGICAL) ×1 IMPLANT
EVACUATOR 1/8 PVC DRAIN (DRAIN) IMPLANT
FACESHIELD WRAPAROUND (MASK) ×3 IMPLANT
FACESHIELD WRAPAROUND OR TEAM (MASK) IMPLANT
GAUZE SPONGE 4X4 12PLY STRL (GAUZE/BANDAGES/DRESSINGS) ×1 IMPLANT
GAUZE XEROFORM 5X9 LF (GAUZE/BANDAGES/DRESSINGS) ×1 IMPLANT
GLOVE BIO SURGEON STRL SZ7.5 (GLOVE) ×3 IMPLANT
GLOVE BIO SURGEON STRL SZ8.5 (GLOVE) ×6 IMPLANT
GLOVE BIOGEL PI IND STRL 8 (GLOVE) ×2 IMPLANT
GLOVE BIOGEL PI IND STRL 9 (GLOVE) ×1 IMPLANT
GLOVE BIOGEL PI INDICATOR 8 (GLOVE) ×4
GLOVE BIOGEL PI INDICATOR 9 (GLOVE) ×2
GOWN STRL REUS W/ TWL LRG LVL3 (GOWN DISPOSABLE) ×1 IMPLANT
GOWN STRL REUS W/ TWL XL LVL3 (GOWN DISPOSABLE) ×2 IMPLANT
GOWN STRL REUS W/TWL LRG LVL3 (GOWN DISPOSABLE) ×3
GOWN STRL REUS W/TWL XL LVL3 (GOWN DISPOSABLE) ×6
HANDPIECE INTERPULSE COAX TIP (DISPOSABLE)
HEAD FEM DLT TS CER 36X+0 (Hips) ×2 IMPLANT
HOOD PEEL AWAY FACE SHEILD DIS (HOOD) ×6 IMPLANT
KIT BASIN OR (CUSTOM PROCEDURE TRAY) ×3 IMPLANT
KIT ROOM TURNOVER OR (KITS) ×3 IMPLANT
LINER MARATHON 10D 36M (Hips) IMPLANT
LINER MARATHON 10DEG 36M (Hips) ×3 IMPLANT
MANIFOLD NEPTUNE II (INSTRUMENTS) ×5 IMPLANT
NEEDLE 22X1 1/2 (OR ONLY) (NEEDLE) ×3 IMPLANT
NS IRRIG 1000ML POUR BTL (IV SOLUTION) ×6 IMPLANT
PACK TOTAL JOINT (CUSTOM PROCEDURE TRAY) ×3 IMPLANT
PACK UNIVERSAL I (CUSTOM PROCEDURE TRAY) ×3 IMPLANT
PAD ARMBOARD 7.5X6 YLW CONV (MISCELLANEOUS) ×6 IMPLANT
PASSER SUT SWANSON 36MM LOOP (INSTRUMENTS) ×2 IMPLANT
PRESSURIZER FEMORAL UNIV (MISCELLANEOUS) IMPLANT
SCREW 6.5MMX35MM (Screw) ×2 IMPLANT
SET HNDPC FAN SPRY TIP SCT (DISPOSABLE) IMPLANT
SLEEVE SURGEON STRL (DRAPES) IMPLANT
SPONGE LAP 18X18 X RAY DECT (DISPOSABLE) IMPLANT
SROM FEM STEM (Hips) ×2 IMPLANT
STAPLER VISISTAT 35W (STAPLE) ×3 IMPLANT
SUT ETHIBOND 2 V 37 (SUTURE) ×3 IMPLANT
SUT VIC AB 0 CTB1 27 (SUTURE) ×3 IMPLANT
SUT VIC AB 1 CTX 36 (SUTURE) ×3
SUT VIC AB 1 CTX36XBRD ANBCTR (SUTURE) ×1 IMPLANT
SUT VIC AB 2-0 CTB1 (SUTURE) ×3 IMPLANT
SUT VIC AB 3-0 CT1 27 (SUTURE) ×3
SUT VIC AB 3-0 CT1 TAPERPNT 27 (SUTURE) ×1 IMPLANT
SUT VIC AB 3-0 FS2 27 (SUTURE) ×2 IMPLANT
SWAB COLLECTION DEVICE MRSA (MISCELLANEOUS) ×2 IMPLANT
SYR 20ML ECCENTRIC (SYRINGE) ×3 IMPLANT
SYR CONTROL 10ML LL (SYRINGE) ×3 IMPLANT
TOWEL OR 17X24 6PK STRL BLUE (TOWEL DISPOSABLE) ×3 IMPLANT
TOWEL OR 17X26 10 PK STRL BLUE (TOWEL DISPOSABLE) ×3 IMPLANT
TOWER CARTRIDGE SMART MIX (DISPOSABLE) IMPLANT
TRAY FOLEY CATH 14FR (SET/KITS/TRAYS/PACK) IMPLANT
TUBE ANAEROBIC SPECIMEN COL (MISCELLANEOUS) ×5 IMPLANT

## 2015-08-10 NOTE — Anesthesia Procedure Notes (Signed)
Procedure Name: Intubation Date/Time: 08/10/2015 1:09 PM Performed by: Eligha Bridegroom Pre-anesthesia Checklist: Patient identified, Timeout performed, Emergency Drugs available, Suction available and Patient being monitored Patient Re-evaluated:Patient Re-evaluated prior to inductionOxygen Delivery Method: Circle system utilized Preoxygenation: Pre-oxygenation with 100% oxygen Intubation Type: IV induction Ventilation: Mask ventilation without difficulty and Oral airway inserted - appropriate to patient size Laryngoscope Size: Mac and 3 Grade View: Grade II Tube type: Oral Tube size: 7.0 mm Number of attempts: 1 Airway Equipment and Method: Stylet and LTA kit utilized Placement Confirmation: ETT inserted through vocal cords under direct vision,  breath sounds checked- equal and bilateral and positive ETCO2 Secured at: 21 cm Tube secured with: Tape Dental Injury: Teeth and Oropharynx as per pre-operative assessment

## 2015-08-10 NOTE — Interval H&P Note (Signed)
History and Physical Interval Note:  08/10/2015 12:47 PM  [name]  has presented today for surgery, with the diagnosis of loose left total hip  The various methods of treatment have been discussed with the patient and family. After consideration of risks, benefits and other options for treatment, the patient has consented to  Procedure(s): LEFT TOTAL HIP REVISION (Left) as a surgical intervention .  [name] history has been reviewed, patient examined, no change in status, stable for surgery.  I have reviewed the patient's chart and labs.  Questions were answered to the patient's satisfaction.     Kara Montoya

## 2015-08-10 NOTE — Transfer of Care (Signed)
Immediate Anesthesia Transfer of Care Note  Patient: Kara Montoya  Procedure(s) Performed: Procedure(s): LEFT TOTAL HIP REVISION (Left)  Patient Location: PACU  Anesthesia Type:General  Level of Consciousness: awake and alert   Airway & Oxygen Therapy: Patient Spontanous Breathing and Patient connected to nasal cannula oxygen  Post-op Assessment: Report given to RN and Post -op Vital signs reviewed and stable  Post vital signs: Reviewed and stable  Last Vitals:  Filed Vitals:   08/10/15 1127  BP: 118/55  Pulse: 59  Temp: 36.7 C  Resp: 16    Complications: No apparent anesthesia complications

## 2015-08-10 NOTE — Op Note (Signed)
Preop diagnosis: Painful ASR on SROM, loosening with vertical span out of the acetabular component left total hip  Postoperative diagnosis: Same  Procedure: Revision left total hip arthroplasty with removal of ASR cup and femoral head and revision to a 58 mm Gryption cup 10, single sector dome screw, polyethylene liner index posterior and superior and a +0 36 mm ceramic head, a new 20 x 15 x 1 50 x 36 with 8 mm offset S-ROM stem.  Surgeon: Feliberto Gottron. Turner Daniels M.D.  Assistant: Tomi Likens. Gaylene Brooks  (present throughout entire procedure and necessary for timely completion of the procedure)  Estimated blood loss: 300 cc  Fluid replacement: 1600 cc of crystalloid  Complications: None  Indications: Patient with an ASR on S-ROM total hip that did very well until a few years ago when she had the onset of recurrent left groin pain. A few days ago the pain got dramatically worse she came into the office and x-rays show that the acetabular component had come loose and spun outgoing vertical with subluxation of the ASR femoral head into the acetabulum. The pain wakes her at night and she cannot put weight on the left lower extremity. Risks and benefits of revision surgery have been discussed and questions answered. This device has been recalled by the manufacturer.  Procedure: Patient was identified by arm band receive preoperative IV antibiotics in the holding area at, and hospital. He was then taken to the operating room where the appropriate anesthetic monitors were attached and general endotracheal anesthesia induced with the patient in the supine position. She was then rolled into the right lateral decubitus position and fixed there with a mark 2 pelvic clamp. The limb prepped and draped in usual sterile fashion from the ankle to the hemipelvis. Time out procedure performed. Skin along the lateral hip and thigh infiltrated with 10 cc of 1/2% Marcaine and epinephrine solution. We began the operation by  recreating the old posterior lateral incision 20 cm in line through the skin and subcutaneous tissue down to the level of the IT band which was cut in line with the skin incision. 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 This tissue was sent off to pathology. The posterior pseudocapsule was peeled off the intertrochanteric crest and we then remove scar tissue from around to the ASR cup and femoral stem trunnion, dislocated a total hip and removed the ASR head with a mallet and metal cylinder. Using a small osteotome we then remove tissue from around the shoulder of the stem broke the seal between the stem and the sleeve with a large screwdriver and removed the stem without difficulty. We then remove scar tissue from around the acetabulum and identified the acetabular component that had spun out inferiorly and removed it without difficulty. More scar tissue was removed from around the edges of the acetabulum and there was minor to moderate bony damage. A fibrous membrane was removed from the acetabulum. We reamed up to a 57 mm basket reamer obtaining good coverage in all quadrants irrigated with normal saline solution. We then hammered into place a 58 mm Gryption cup in 45 of abduction and 20 of anteversion. A single sector dome screws used for supplemental fixation. A 10 polyethylene liner index posterior and superior was hammered into place. A trial 15 x 20 x 36 x 1 58 mm offset stem with a +0 36 mm head and 30 of anteversion was then placed.  A trial reduction was then performed with stability to 90 of flexion 60 of internal rotation and in full extension the hip could not be dislocated with external rotation. The trial stem was removed and a real 20 x 15 x 36 x 1 58 mm offset S-ROM stem was hammered into place in 30 of anteversion. At this point a real +0 36 mm ceramic head was hammered into place, the hip reduced and irrigated with normal saline solution. The capsular  flap was repaired back to the intertrochanteric crest through drill holes with #2 Ethibond suture. We then closed the IT band with running #1 Vicryl suture, the subcutaneous tissue with 0 and 2-0 undyed Vicryl suture, the skin was closed with running interlocking 3-0 nylon suture. A dressing of aquacil was then applied, the patient was unclamped a rolled supine awakened extubated and taken to the recovery without difficulty.

## 2015-08-10 NOTE — Anesthesia Preprocedure Evaluation (Signed)
Anesthesia Evaluation  Patient identified by MRN, date of birth, ID band Patient awake    Reviewed: Allergy & Precautions, NPO status   History of Anesthesia Complications (+) PONV  Airway Mallampati: II  TM Distance: >3 FB Neck ROM: Full    Dental   Pulmonary neg pulmonary ROS,  breath sounds clear to auscultation        Cardiovascular hypertension, Rhythm:Regular Rate:Normal     Neuro/Psych    GI/Hepatic negative GI ROS, Neg liver ROS,   Endo/Other  diabetes  Renal/GU negative Renal ROS     Musculoskeletal   Abdominal   Peds  Hematology   Anesthesia Other Findings   Reproductive/Obstetrics                             Anesthesia Physical Anesthesia Plan  ASA: III  Anesthesia Plan:    Post-op Pain Management:    Induction: Intravenous  Airway Management Planned: Oral ETT  Additional Equipment:   Intra-op Plan:   Post-operative Plan:   Informed Consent: I have reviewed the patients History and Physical, chart, labs and discussed the procedure including the risks, benefits and alternatives for the proposed anesthesia with the patient or authorized representative who has indicated his/her understanding and acceptance.   Dental advisory given  Plan Discussed with: CRNA and Anesthesiologist  Anesthesia Plan Comments:         Anesthesia Quick Evaluation

## 2015-08-10 NOTE — Discharge Instructions (Signed)

## 2015-08-10 NOTE — Progress Notes (Signed)
Orthopedic Tech Progress Note Patient Details:  CHARO PHILIPP 04-13-1941 [id number] Patient unable to safely use OHF Patient ID: TALIYA MCCLARD, female   DOB: November 03, 1941, 74 y.o.   MRN: [id number]   Orie Rout 08/10/2015, 11:23 PM

## 2015-08-11 ENCOUNTER — Encounter (HOSPITAL_COMMUNITY): Payer: Self-pay | Admitting: General Practice

## 2015-08-11 LAB — GLUCOSE, CAPILLARY
Glucose-Capillary: 109 mg/dL — ABNORMAL HIGH (ref 65–99)
Glucose-Capillary: 143 mg/dL — ABNORMAL HIGH (ref 65–99)
Glucose-Capillary: 140 mg/dL — ABNORMAL HIGH (ref 65–99)
Glucose-Capillary: 129 mg/dL — ABNORMAL HIGH (ref 65–99)

## 2015-08-11 LAB — CBC
HCT: 34.9 % — ABNORMAL LOW (ref 36.0–46.0)
Hemoglobin: 11 g/dL — ABNORMAL LOW (ref 12.0–15.0)
MCH: 30.6 pg (ref 26.0–34.0)
MCHC: 31.5 g/dL (ref 30.0–36.0)
MCV: 97.2 fL (ref 78.0–100.0)
PLATELETS: 172 10*3/uL (ref 150–400)
RBC: 3.59 MIL/uL — AB (ref 3.87–5.11)
RDW: 14.6 % (ref 11.5–15.5)
WBC: 7.4 10*3/uL (ref 4.0–10.5)

## 2015-08-11 LAB — BASIC METABOLIC PANEL
ANION GAP: 4 — AB (ref 5–15)
BUN: 11 mg/dL (ref 6–20)
CO2: 30 mmol/L (ref 22–32)
Calcium: 9 mg/dL (ref 8.9–10.3)
Chloride: 103 mmol/L (ref 101–111)
Creatinine, Ser: 0.94 mg/dL (ref 0.44–1.00)
GFR, EST NON AFRICAN AMERICAN: 58 mL/min — AB (ref >60)
GLUCOSE: 128 mg/dL — AB (ref 65–99)
POTASSIUM: 4.3 mmol/L (ref 3.5–5.1)
Sodium: 137 mmol/L (ref 135–145)

## 2015-08-11 NOTE — Evaluation (Signed)
Occupational Therapy Evaluation Patient Details Name: Kara Montoya MRN: [id number] DOB: 1941-06-18 Today's Date: 08/11/2015    History of Present Illness Revision left total hip arthroplasty    Clinical Impression   Patient presenting with decreased ADL, IADL, functional mobility independence secondary to above. Patient independent > mod I PTA. Patient currently functioning at a min>total assist level (requiring total assist for LB ADLs).  Patient will benefit from acute OT to increase overall independence in the areas of ADLs, functional mobility, and overall safety in order to safely discharge home with assistance from husband. Depending on progress, may want to consider [facility] SNF for rehab prior to discharge home.      Follow Up Recommendations  Home health OT;Supervision/Assistance - 24 hour    Equipment Recommendations  3 in 1 bedside comode    Recommendations for Other Services  None at this time   Precautions / Restrictions Precautions Precautions: Posterior Hip Precaution Booklet Issued: Yes (comment) Precaution Comments: reviewed precautions and posted, pt unable to recall after education Restrictions Weight Bearing Restrictions: Yes LLE Weight Bearing: Weight bearing as tolerated    Mobility Bed Mobility Overal bed mobility: Needs Assistance Bed Mobility: Supine to Sit     Supine to sit: Mod assist;HOB elevated     General bed mobility comments: mod assist for management of BLEs, min assist for trunk support with HOB elevated  Transfers Overall transfer level: Needs assistance Equipment used: Rolling walker (2 wheeled) Transfers: Sit to/from Stand Sit to Stand: Mod assist General transfer comment: mod assist for lift/lower, cues for hand placement and encouragement to weight bear through LLE    Balance Overall balance assessment: Needs assistance Sitting-balance support: No upper extremity supported;Feet supported Sitting balance-Leahy Scale: Fair Sitting  balance - Comments: patient with right lateral lean, suspect due to pain?   Standing balance support: Bilateral upper extremity supported;During functional activity Standing balance-Leahy Scale: Poor    ADL Overall ADL's : Needs assistance/impaired General ADL Comments: Pt total assist for LB ADLs secondary to posterior hip precautions. Pt min>mod assist for functional transfers and takes increased time.     Pertinent Vitals/Pain Pain Assessment: 0-10 Pain Score: 8  Pain Location: left hip Pain Descriptors / Indicators: Aching;Sore Pain Intervention(s): Monitored during session;Limited activity within patient's tolerance;Repositioned     Hand Dominance Right   Extremity/Trunk Assessment Upper Extremity Assessment Upper Extremity Assessment: Generalized weakness   Lower Extremity Assessment Lower Extremity Assessment: Defer to PT evaluation LLE Deficits / Details: requiring assistance to move with bed mobility, poor quad activation   Cervical / Trunk Assessment Cervical / Trunk Assessment: Normal   Communication Communication Communication: No difficulties   Cognition Arousal/Alertness: Lethargic;Suspect due to medications Behavior During Therapy: [facility] for tasks assessed/performed Overall Cognitive Status: Within Functional Limits for tasks assessed              Home Living Family/patient expects to be discharged to:: Private residence Living Arrangements: Spouse/significant other Available Help at Discharge: Family;Available PRN/intermittently (works when he gets jobs (husband is a Designer, fashion/clothing)) Type of Home: House Home Access: Ramped entrance     Home Layout: One level     Bathroom Shower/Tub: Walk-in Pensions consultant: Handicapped height     Home Equipment: Environmental consultant - 2 wheels;Shower seat;Shower seat - built in;Bedside commode   Additional Comments: pt reports it's an old BSC      Prior Functioning/Environment Level of Independence: Independent         Comments: denies using any assistive devices  for mobility     OT Diagnosis: Generalized weakness;Acute pain   OT Problem List: Decreased strength;Decreased activity tolerance;Impaired balance (sitting and/or standing);Decreased safety awareness;Pain;Decreased knowledge of use of DME or AE;Decreased knowledge of precautions   OT Treatment/Interventions: Self-care/ADL training;Energy conservation;DME and/or AE instruction;Therapeutic activities;Patient/family education;Balance training    OT Goals(Current goals can be found in the care plan section) Acute Rehab OT Goals Patient Stated Goal: go home OT Goal Formulation: With patient/family Time For Goal Achievement: 08/25/15 Potential to Achieve Goals: Good ADL Goals Pt Will Perform Grooming: with modified independence;standing Pt Will Perform Lower Body Bathing: with modified independence;sit to/from stand;with adaptive equipment Pt Will Perform Lower Body Dressing: with modified independence;sit to/from stand;with adaptive equipment Pt Will Transfer to Toilet: with modified independence;ambulating;bedside commode Pt Will Perform Tub/Shower Transfer: Shower transfer;3 in 1;rolling walker;ambulating;with supervision Additional ADL Goal #1: Pt will independently verbalize and adhere to posterior hip precautions 100% of the time  OT Frequency: Min 2X/week   Barriers to D/C: None known at this time   End of Session Equipment Utilized During Treatment: Gait belt;Rolling walker  Activity Tolerance: Patient tolerated treatment well Patient left: in chair;with call bell/phone within reach;with nursing/sitter in room   Time: 1141-1208 OT Time Calculation (min): 27 min Charges:  OT General Charges $OT Visit: 1 Procedure OT Evaluation $Initial OT Evaluation Tier I: 1 Procedure OT Treatments $Self Care/Home Management : 8-22 mins  Angelo Prindle , MS, OTR/L, CLT Pager: [phone]  08/11/2015, 12:35 PM

## 2015-08-11 NOTE — Plan of Care (Signed)
Problem: Consults Goal: Diagnosis- Total Joint Replacement Revision Total Hip     

## 2015-08-11 NOTE — Evaluation (Signed)
Physical Therapy Evaluation Patient Details Name: Kara Montoya MRN: [id number] DOB: [birth year] Today's Date: 08/11/2015   History of Present Illness  Revision left total hip arthroplasty   Clinical Impression  Pt is s/p THA revision resulting in the deficits listed below (see PT Problem List). Pt will benefit from skilled PT to increase their independence and safety with mobility to allow discharge to home. Patient reports having rolling walker already and ramped entrance. Slow initial mobilization with encouragement needed throughout for active use of LLE.       Follow Up Recommendations Home health PT;Supervision for mobility/OOB    Equipment Recommendations  Other (comment) (patient reports having all equipment needs met already)    Recommendations for Other Services       Precautions / Restrictions Precautions Precautions: Posterior Hip Precaution Booklet Issued: Yes (comment) Precaution Comments: reviewed precautions and posted Restrictions Weight Bearing Restrictions: Yes LLE Weight Bearing: Weight bearing as tolerated      Mobility  Bed Mobility Overal bed mobility: Needs Assistance Bed Mobility: Supine to Sit     Supine to sit: Max assist     General bed mobility comments: requiring assistance with LLE and trunk with supine to sit  Transfers Overall transfer level: Needs assistance Equipment used: Rolling walker (2 wheeled) Transfers: Sit to/from Stand Sit to Stand: Mod assist         General transfer comment: cues for hand placement and reminder of precautions  Ambulation/Gait Ambulation/Gait assistance: Min assist Ambulation Distance (Feet): 5 Feet Assistive device: Rolling walker (2 wheeled) Gait Pattern/deviations: Step-to pattern;Decreased step length - left;Decreased stance time - left;Decreased weight shift to left Gait velocity: decreased   General Gait Details: slow and guarded pattern, encouraging gradual weight shift to LLE.   Stairs             Wheelchair Mobility    Modified Rankin (Stroke Patients Only)       Balance Overall balance assessment: Needs assistance Sitting-balance support: Single extremity supported Sitting balance-Leahy Scale: Poor     Standing balance support: Bilateral upper extremity supported Standing balance-Leahy Scale: Poor                               Pertinent Vitals/Pain Pain Assessment: 0-10 Pain Score: 9  Pain Location: Lt hip Pain Descriptors / Indicators: Aching;Stabbing Pain Intervention(s): Monitored during session;Repositioned    Home Living Family/patient expects to be discharged to:: Private residence Living Arrangements: Spouse/significant other Available Help at Discharge: Family Type of Home: House Home Access: Ramped entrance     Home Layout: One level Home Equipment: Environmental consultant - 2 wheels      Prior Function Level of Independence: Independent         Comments: denies using any assistive devices for mobility      Hand Dominance        Extremity/Trunk Assessment               Lower Extremity Assessment: LLE deficits/detail   LLE Deficits / Details: requiring assistance to move with bed mobility, poor quad activation     Communication   Communication: No difficulties  Cognition Arousal/Alertness: Awake/alert Behavior During Therapy: WFL for tasks assessed/performed Overall Cognitive Status: Within Functional Limits for tasks assessed                      General Comments      Exercises Total Joint Exercises Ankle Circles/Pumps: AROM;Both;15  reps Quad Sets: Strengthening;Both;10 reps Gluteal Sets: Strengthening;Both;10 reps Hip ABduction/ADduction: Right;10 reps;Strengthening (max assist)      Assessment/Plan    PT Assessment Patient needs continued PT services  PT Diagnosis Difficulty walking;Acute pain   PT Problem List Decreased strength;Decreased range of motion;Decreased activity  tolerance;Decreased balance;Decreased mobility;Decreased knowledge of precautions;Pain  PT Treatment Interventions DME instruction;Gait training;Functional mobility training;Therapeutic activities;Therapeutic exercise;Balance training;Patient/family education   PT Goals (Current goals can be found in the Care Plan section) Acute Rehab PT Goals Patient Stated Goal: go home PT Goal Formulation: With patient Time For Goal Achievement: 08/25/15 Potential to Achieve Goals: Good    Frequency Min 4X/week   Barriers to discharge        Co-evaluation               End of Session Equipment Utilized During Treatment: Gait belt Activity Tolerance: Patient limited by pain Patient left: in chair;with call bell/phone within reach Nurse Communication: Mobility status         Time: 3817-7116 PT Time Calculation (min) (ACUTE ONLY): 29 min   Charges:   PT Evaluation $Initial PT Evaluation Tier I: 1 Procedure PT Treatments $Therapeutic Activity: 8-22 mins   PT G Codes:        Cassell Clement, PT, CSCS Pager [phone] Office [phone]  08/11/2015, 11:14 AM

## 2015-08-11 NOTE — Anesthesia Postprocedure Evaluation (Signed)
  Anesthesia Post-op Note  Patient: Kara Montoya  Procedure(s) Performed: Procedure(s): LEFT TOTAL HIP REVISION (Left)  Patient Location: PACU  Anesthesia Type:General  Level of Consciousness: awake  Airway and Oxygen Therapy: Patient Spontanous Breathing  Post-op Pain: mild  Post-op Assessment: Post-op Vital signs reviewed LLE Motor Response: Purposeful movement, Responds to commands LLE Sensation: Full sensation, Pain          Post-op Vital Signs: Reviewed  Last Vitals:  Filed Vitals:   08/11/15 0554  BP: 98/52  Pulse: 71  Temp: 37.2 C  Resp: 18    Complications: No apparent anesthesia complications

## 2015-08-11 NOTE — Progress Notes (Addendum)
Patient ID: Kara Montoya, female   DOB: [birth year], 74 y.o.   MRN: [id number] PATIENT ID: Kara Montoya  MRN: [id number]  DOB/AGE:  21-May-1941 / 74 y.o.  1 Day Post-Op Procedure(s) (LRB): LEFT TOTAL HIP REVISION (Left)    PROGRESS NOTE Subjective: Patient is alert, oriented, x1 Nausea, no Vomiting, yes passing gas, no Bowel Movement. Taking PO well. Denies SOB, Chest or Calf Pain. Using Incentive Spirometer, PAS in place. Ambulate wBAT Patient reports pain as 4 on 0-10 scale  .    Objective: Vital signs in last 24 hours: Filed Vitals:   08/10/15 1727 08/10/15 2030 08/11/15 0004 08/11/15 0554  BP: 133/51 130/56 117/55 98/52  Pulse: 64 79 82 71  Temp: 98.3 F (36.8 C) 98.6 F (37 C) 100 F (37.8 C) 99 F (37.2 C)  TempSrc:  Oral Oral Oral  Resp: SpO2: 100% 100% 100% 100%      Intake/Output from previous day: I/O last 3 completed shifts: In: 2700 [I.V.:2450; IV Piggyback:250] Out: 230 [Blood:230]   Intake/Output this shift:     LABORATORY DATA:  Recent Labs  08/09/15 1535  08/10/15 1522 08/10/15 2153 08/11/15 0633  WBC 5.9  --   --   --   --   HGB 14.3  --   --   --   --   HCT 44.5  --   --   --   --   PLT 221  --   --   --   --   NA 143  --   --   --   --   K 3.9  --   --   --   --   CL 106  --   --   --   --   CO2 29  --   --   --   --   BUN 11  --   --   --   --   CREATININE 0.80  --   --   --   --   GLUCOSE 144*  --   --   --   --   GLUCAP  --   < > 95 130* 109*  INR 1.09  --   --   --   --   CALCIUM 9.9  --   --   --   --   < > = values in this interval not displayed.   Cults of hip neg at 24hr  Examination: Neurologically intact ABD soft Neurovascular intact Sensation intact distally Intact pulses distally Dorsiflexion/Plantar flexion intact Incision: dressing C/D/I and moderate drainage No cellulitis present Compartment soft} XR AP&Lat of hip shows well placed\fixed THA  Assessment:   1 Day Post-Op Procedure(s) (LRB): LEFT  TOTAL HIP REVISION (Left) ADDITIONAL DIAGNOSIS:  Expected Acute Blood Loss Anemia, HTN, DM  Plan: PT/OT WBAT, THA  posterior precautions, new aquacil dressing  DVT Prophylaxis: SCDx72 hrs, ASA 325 mg BID x 2 weeks  DISCHARGE PLAN: Home  DISCHARGE NEEDS: HHPT, Walker and 3-in-1 comode seat

## 2015-08-11 NOTE — Progress Notes (Signed)
Utilization review completed. Gratia Disla, RN, BSN. 

## 2015-08-11 NOTE — Progress Notes (Signed)
Physical Therapy Treatment Patient Details Name: Kara Montoya MRN: [id number] DOB: 1941/08/11 Today's Date: 08/11/2015    History of Present Illness Revision left total hip arthroplasty     PT Comments    Patient with gradual increased mobility in afternoon. Patient requiring min-moderate assistance with bed mobility and transfers in addition to additional time and cues to complete. Patient ambulating 8 feet with rw, requiring frequent cues for gait sequence throughout. Reviewed posterior precautions with patient verbally and with written reinforcement.   Follow Up Recommendations  Home health PT;Supervision for mobility/OOB     Equipment Recommendations  None recommended by PT    Recommendations for Other Services       Precautions / Restrictions Precautions Precautions: Posterior Hip Precaution Booklet Issued: Yes (comment) Precaution Comments: Patient recalling 1/3 precautions, reviewed.  Restrictions Weight Bearing Restrictions: Yes LLE Weight Bearing: Weight bearing as tolerated    Mobility  Bed Mobility Overal bed mobility: Needs Assistance Bed Mobility: Supine to Sit     Supine to sit: Mod assist     General bed mobility comments: slow with mobility, needing mod assistance + time  Transfers Overall transfer level: Needs assistance Equipment used: Rolling walker (2 wheeled) Transfers: Sit to/from Stand Sit to Stand: Min assist         General transfer comment: min assist plus time  Ambulation/Gait Ambulation/Gait assistance: Min assist Ambulation Distance (Feet): 8 Feet Assistive device: Rolling walker (2 wheeled) Gait Pattern/deviations: Decreased step length - left;Decreased stance time - left;Decreased weight shift to left Gait velocity: decreased   General Gait Details: cues for gait sequence with rw, slow pattern.    Stairs            Wheelchair Mobility    Modified Rankin (Stroke Patients Only)       Balance Overall balance  assessment: Needs assistance Sitting-balance support: No upper extremity supported Sitting balance-Leahy Scale: Fair Sitting balance - Comments: patient with right lateral lean, suspect due to pain?   Standing balance support: Bilateral upper extremity supported Standing balance-Leahy Scale: Poor                      Cognition Arousal/Alertness: Awake/alert Behavior During Therapy: WFL for tasks assessed/performed Overall Cognitive Status: Within Functional Limits for tasks assessed                      Exercises      General Comments        Pertinent Vitals/Pain Pain Assessment: 0-10 Pain Score: 8  Pain Location: Lt hip Pain Descriptors / Indicators: Aching Pain Intervention(s): Monitored during session;Repositioned;Patient requesting pain meds-RN notified    Home Living                      Prior Function            PT Goals (current goals can now be found in the care plan section) Acute Rehab PT Goals Patient Stated Goal: go home PT Goal Formulation: With patient Time For Goal Achievement: 08/25/15 Potential to Achieve Goals: Good Progress towards PT goals: Progressing toward goals    Frequency  Min 4X/week    PT Plan Current plan remains appropriate    Co-evaluation             End of Session Equipment Utilized During Treatment: Gait belt Activity Tolerance: Patient limited by fatigue;Other (comment) (reports stomach getting upset) Patient left: in chair;with call bell/phone within reach;with family/visitor  present     Time: 0981-1914 PT Time Calculation (min) (ACUTE ONLY): 23 min  Charges:  $Gait Training: 8-22 mins $Therapeutic Activity: 8-22 mins                    G Codes:      Christiane Ha, PT, CSCS Pager [phone] Office [phone]  08/11/2015, 4:01 PM

## 2015-08-12 LAB — CBC
HEMATOCRIT: 30.3 % — AB (ref 36.0–46.0)
Hemoglobin: 9.6 g/dL — ABNORMAL LOW (ref 12.0–15.0)
MCH: 29.8 pg (ref 26.0–34.0)
MCHC: 31.7 g/dL (ref 30.0–36.0)
MCV: 94.1 fL (ref 78.0–100.0)
PLATELETS: 175 10*3/uL (ref 150–400)
RBC: 3.22 MIL/uL — AB (ref 3.87–5.11)
RDW: 14.2 % (ref 11.5–15.5)
WBC: 9.9 10*3/uL (ref 4.0–10.5)

## 2015-08-12 LAB — GLUCOSE, CAPILLARY
GLUCOSE-CAPILLARY: 114 mg/dL — AB (ref 65–99)
GLUCOSE-CAPILLARY: 108 mg/dL — AB (ref 65–99)
GLUCOSE-CAPILLARY: 119 mg/dL — AB (ref 65–99)
Glucose-Capillary: 102 mg/dL — ABNORMAL HIGH (ref 65–99)

## 2015-08-12 NOTE — Progress Notes (Signed)
Physical Therapy Treatment Patient Details Name: Kara Montoya MRN: [id number] DOB: [birth year] Today's Date: 08/12/2015    History of Present Illness Revision left total hip arthroplasty     PT Comments    Patient continues to make slow progress. Unsure if patient will have 24/7 assistance at home. Unsafe at this time to DC home without 24.7. Will reassess this afternoon  Follow Up Recommendations  Home health PT;Supervision for mobility/OOB     Equipment Recommendations  None recommended by PT    Recommendations for Other Services       Precautions / Restrictions Precautions Precautions: Posterior Hip Precaution Comments: Reviewed hip precautions Restrictions LLE Weight Bearing: Weight bearing as tolerated    Mobility  Bed Mobility Overal bed mobility: Needs Assistance Bed Mobility: Supine to Sit     Supine to sit: Min assist     General bed mobility comments: Min A to assist LLE out of bed. Cues for positionig and to maintian precautions  Transfers Overall transfer level: Needs assistance Equipment used: Rolling walker (2 wheeled)   Sit to Stand: Min assist         General transfer comment: Min A to ppower up into stand and cues for safe technique. Increased time  Ambulation/Gait Ambulation/Gait assistance: Min assist Ambulation Distance (Feet): 15 Feet Assistive device: Rolling walker (2 wheeled) Gait Pattern/deviations: Step-to pattern;Decreased step length - left;Decreased stance time - right;Decreased stance time - left Gait velocity: decreased   General Gait Details: cues for gait sequence with rw, slow pattern.    Stairs            Wheelchair Mobility    Modified Rankin (Stroke Patients Only)       Balance                                    Cognition Arousal/Alertness: Awake/alert Behavior During Therapy: WFL for tasks assessed/performed Overall Cognitive Status: Within Functional Limits for tasks assessed                      Exercises Total Joint Exercises Quad Sets: Strengthening;Both;10 reps Gluteal Sets: Strengthening;Both;10 reps Heel Slides: AAROM;Right;10 reps Hip ABduction/ADduction: Right;10 reps;Strengthening Long Arc Quad: AAROM;Right;10 reps    General Comments        Pertinent Vitals/Pain Pain Score: 5  Pain Location: Lt hip Pain Descriptors / Indicators: Aching;Sore Pain Intervention(s): Monitored during session    Home Living                      Prior Function            PT Goals (current goals can now be found in the care plan section) Progress towards PT goals: Progressing toward goals    Frequency  7X/week    PT Plan Current plan remains appropriate    Co-evaluation             End of Session Equipment Utilized During Treatment: Gait belt Activity Tolerance: Patient limited by fatigue Patient left: in chair;with call bell/phone within reach     Time: 1041-1107 PT Time Calculation (min) (ACUTE ONLY): 26 min  Charges:  $Gait Training: 8-22 mins $Therapeutic Exercise: 8-22 mins                    G Codes:      Fredrich Birks 08/12/2015, 12:34 PM  08/12/2015 Mckinley Jewel  [place] pager [phone] office

## 2015-08-12 NOTE — Progress Notes (Signed)
Occupational Therapy Treatment Patient Details Name: Kara Montoya MRN: [id number] DOB: 1941/05/27 Today's Date: 08/12/2015    History of present illness Revision left total hip arthroplasty    OT comments  Pt with 8/10 c/o pain in L hip this session. Pt calling for pain medication and it was given per RN during session. Pt agreeable to OT intervention. Supine >sit with min A and use of bed rail. Pt ambulating 8' with RW and min A to recliner chair. OT propelled pt in recliner chair to gym for energy conservation. OT educated pt on walk in shower transfer with use of RW and shower chair. Pt returned demonstration with min A and min verbal cues for safety and proper technique. OT recommended safety treads and non slip bath mat outside of shower in order to decrease fall risk. OT also reviewing LE ADL AE with pt declining practice and stating that she already owns equipment except for long handled sponge. Pt returned to room via recliner chair where she wished to remain for dinner. Call bell and all other needed items within reach.   Follow Up Recommendations  Home health OT;Supervision/Assistance - 24 hour    Equipment Recommendations  3 in 1 bedside comode;Tub/shower seat    Recommendations for Other Services      Precautions / Restrictions Precautions Precautions: Posterior Hip Precaution Comments: Reviewed hip precautions Restrictions Weight Bearing Restrictions: Yes LLE Weight Bearing: Weight bearing as tolerated       Mobility Bed Mobility Overal bed mobility: Needs Assistance Bed Mobility: Sit to Supine     Supine to sit: Min assist Sit to supine: Min assist   General bed mobility comments: Min A for L LE for positioning and to maintain precautions  Transfers Overall transfer level: Needs assistance Equipment used: Rolling walker (2 wheeled) Transfers: Sit to/from Stand Sit to Stand: Min assist         General transfer comment: Min A to ppower up into stand and  cues for safe technique. Increased time    Balance Overall balance assessment: Needs assistance Sitting-balance support: No upper extremity supported Sitting balance-Leahy Scale: Fair Sitting balance - Comments: R lateral lean secondary to pain   Standing balance support: Bilateral upper extremity supported Standing balance-Leahy Scale: Poor                     ADL Overall ADL's : Needs assistance/impaired                                 Tub/ Shower Transfer: Walk-in shower;Minimal assistance;Ambulation;Shower Field seismologist Details (indicate cue type and reason): Pt required min verbal cues for safety and proper technique   General ADL Comments: OT educated pt on use of AE equipment for LB bathing and dressing. Pt stating she owns sock aide, Long handled reacher, and long handled shoe horn and that she already knows how to utilize and declined further instruction today. OT recommended pt purchase long handed sponge in order to increase I with bathing. Pt verbalized understanding.       Vision                     Perception     Praxis      Cognition   Behavior During Therapy: [facility] for tasks assessed/performed Overall Cognitive Status: Within Functional Limits for tasks assessed  Pertinent Vitals/ Pain       Pain Assessment: 0-10 Pain Score: 8  Pain Location: L hip Pain Descriptors / Indicators: Aching;Sore Pain Intervention(s): Patient requesting pain meds-RN notified;RN gave pain meds during session;Monitored during session;Repositioned         Frequency Min 2X/week     Progress Toward Goals  OT Goals(current goals can now be found in the care plan section)  Progress towards OT goals: Progressing toward goals     Plan Discharge plan remains appropriate       End of Session Equipment Utilized During Treatment: Rolling walker;Other (comment) (simulated walk in shower transfer  with shower seat)   Activity Tolerance Patient limited by pain   Patient Left in chair;with call bell/phone within reach           Time: 1540-1608 OT Time Calculation (min): 28 min  Charges: OT Treatments $Self Care/Home Management : 23-37 mins  Lowella Grip, MS, OTR/L 08/12/2015, 4:17 PM

## 2015-08-12 NOTE — Progress Notes (Signed)
PATIENT ID: Kara Montoya  MRN: [id number]  DOB/AGE:  09-12-1941 / 74 y.o.  2 Days Post-Op Procedure(s) (LRB): LEFT TOTAL HIP REVISION (Left)    PROGRESS NOTE Subjective: Patient is alert, oriented, yes Nausea, mild Vomiting, yes passing gas, no Bowel Movement. Taking PO with small bites. Denies SOB, Chest or Calf Pain. Using Incentive Spirometer, PAS in place. Ambulate WBAT with Pt walking 8 ft x2 yesterday with therapy.  Pt was up in room coming back from the restroom with her husband assisting when I came in the room.  She was using a walker. Patient reports pain as 8 on 0-10 scale  .    Objective: Vital signs in last 24 hours: Filed Vitals:   08/11/15 0554 08/11/15 1300 08/11/15 2138 08/12/15 0404  BP: 98/52 111/52 128/46 114/51  Pulse: 71 95 92 82  Temp: 99 F (37.2 C) 98.3 F (36.8 C) 98.6 F (37 C) 98 F (36.7 C)  TempSrc: Oral Oral Oral Oral  Resp: SpO2: 100% 100% 100% 96%      Intake/Output from previous day: I/O last 3 completed shifts: In: 580 [P.O.:480; Other:100] Out: -    Intake/Output this shift:     LABORATORY DATA:  Recent Labs  08/09/15 1535  08/11/15 0738  08/11/15 1618 08/11/15 2152 08/12/15 0416 08/12/15 0659  WBC 5.9  --  7.4  --   --   --  9.9  --   HGB 14.3  --  11.0*  --   --   --  9.6*  --   HCT 44.5  --  34.9*  --   --   --  30.3*  --   PLT 221  --  172  --   --   --  175  --   NA 143  --  137  --   --   --   --   --   K 3.9  --  4.3  --   --   --   --   --   CL 106  --  103  --   --   --   --   --   CO2 29  --  30  --   --   --   --   --   BUN 11  --  11  --   --   --   --   --   CREATININE 0.80  --  0.94  --   --   --   --   --   GLUCOSE 144*  --  128*  --   --   --   --   --   GLUCAP  --   < >  --   < > 140* 129*  --  114*  INR 1.09  --   --   --   --   --   --   --   CALCIUM 9.9  --  9.0  --   --   --   --   --   < > = values in this interval not displayed.  Examination: Neurologically intact Neurovascular  intact Sensation intact distally Intact pulses distally Dorsiflexion/Plantar flexion intact Incision: dressing C/D/I No cellulitis present Compartment soft} XR AP&Lat of hip shows well placed\fixed THA  Assessment:   2 Days Post-Op Procedure(s) (LRB): LEFT TOTAL HIP REVISION (Left) ADDITIONAL DIAGNOSIS:  Expected Acute Blood Loss Anemia, Diabetes and Hypertension  Plan:  PT/OT WBAT, THA  posterior precautions  DVT Prophylaxis: SCDx72 hrs, ASA 325 mg BID x 2 weeks  DISCHARGE PLAN: Home  DISCHARGE NEEDS: HHPT, HHRN, Walker and 3-in-1 comode seat

## 2015-08-12 NOTE — Care Management Note (Signed)
Case Management Note  Patient Details  Name: Kara Montoya MRN: [id number] Date of Birth: [birth year]  Subjective/Objective:     74 yr old female s/p left total hip                Action/Plan:  Case manager spoke with patient concerning home health and DME needs. Choice was offered. Referral was called to [facility], [facility]. Patient will have family support at discharge.   Expected Discharge Date:    08/13/15              Expected Discharge Plan:   Home with Home Health  In-House Referral:  NA  Discharge planning Services  CM Consult  Post Acute Care Choice:  Home Health Choice offered to:  Patient  DME Arranged:  3-N-1, Walker rolling DME Agency:  [facility]  HH Arranged:  PT HH Agency:  [facility]  Status of Service:  Completed, signed off  Medicare Important Message Given:    Date Medicare IM Given:    Medicare IM give by:    Date Additional Medicare IM Given:    Additional Medicare Important Message give by:     If discussed at Long Length of Stay Meetings, dates discussed:    Additional Comments:  Durenda Guthrie, RN 08/12/2015, 10:12 AM

## 2015-08-12 NOTE — Progress Notes (Signed)
Physical Therapy Treatment Patient Details Name: Kara Montoya MRN: [id number] DOB: 20-May-1941 Today's Date: 08/12/2015    History of Present Illness Revision left total hip arthroplasty     PT Comments    Patient continues to make slow progress but was able to ambulate slightly further this afternoon. Spoke with patient in depth regarding need for assistance at home. Patient stated that he daughter would be there until Sunday then her husband will be at home with her the rest of the time. Patient did state that husband did odd jobs that required him to be out of the house some. Educated patient that at this time she would need 24/7 assistance. She is going to speak with her husband so he is educated on this as well. RN made aware. I believe as long as patient has assistance once home she will be safe to be DC'd once mobility and ambulation has improved. She does have a ramp to enter her house.   Follow Up Recommendations  Home health PT;Supervision for mobility/OOB     Equipment Recommendations  None recommended by PT    Recommendations for Other Services       Precautions / Restrictions Precautions Precautions: Posterior Hip Precaution Comments: Reviewed hip precautions Restrictions LLE Weight Bearing: Weight bearing as tolerated    Mobility  Bed Mobility Overal bed mobility: Needs Assistance Bed Mobility: Sit to Supine     Supine to sit: Min assist Sit to supine: Min assist   General bed mobility comments: Min A to assist LLE. Cues for positionig and to maintian precautions  Transfers Overall transfer level: Needs assistance Equipment used: Rolling walker (2 wheeled)   Sit to Stand: Min assist         General transfer comment: Min A to ppower up into stand and cues for safe technique. Increased time  Ambulation/Gait Ambulation/Gait assistance: Min guard Ambulation Distance (Feet): 35 Feet Assistive device: Rolling walker (2 wheeled) Gait Pattern/deviations:  Step-to pattern;Decreased step length - left;Decreased stance time - left;Decreased step length - right Gait velocity: decreased   General Gait Details: Cues for posture and safe positioning of RW. Limited due to fatigue and pain   Stairs            Wheelchair Mobility    Modified Rankin (Stroke Patients Only)       Balance                                    Cognition Arousal/Alertness: Awake/alert Behavior During Therapy: WFL for tasks assessed/performed Overall Cognitive Status: Within Functional Limits for tasks assessed                      Exercises Total Joint Exercises Quad Sets: Strengthening;Both;10 reps Gluteal Sets: Strengthening;Both;10 reps Short Arc QuadBarbaraann Boys;Right;10 reps Heel Slides: AAROM;Right;10 reps Hip ABduction/ADduction: Right;10 reps;AAROM Long Arc Quad: AAROM;Right;10 reps    General Comments        Pertinent Vitals/Pain Pain Score: 6  Pain Location: L hip with ambulation Pain Descriptors / Indicators: Aching Pain Intervention(s): Repositioned;Monitored during session;Limited activity within patient's tolerance    Home Living                      Prior Function            PT Goals (current goals can now be found in the care plan section) Progress towards  PT goals: Progressing toward goals    Frequency  7X/week    PT Plan Current plan remains appropriate    Co-evaluation             End of Session Equipment Utilized During Treatment: Gait belt Activity Tolerance: Patient limited by fatigue Patient left: in bed;with call bell/phone within reach     Time: 1316-1348 PT Time Calculation (min) (ACUTE ONLY): 32 min  Charges:  $Gait Training: 8-22 mins $Therapeutic Exercise: 8-22 mins                    G Codes:      Fredrich Birks 08/12/2015, 1:56 PM 08/12/2015 Fredrich Birks PTA [phone] pager [phone] office

## 2015-08-13 DIAGNOSIS — D62 Acute posthemorrhagic anemia: Secondary | ICD-10-CM

## 2015-08-13 LAB — BODY FLUID CULTURE: Culture: NO GROWTH

## 2015-08-13 LAB — CBC
HCT: 29 % — ABNORMAL LOW (ref 36.0–46.0)
HEMOGLOBIN: 9.2 g/dL — AB (ref 12.0–15.0)
MCH: 30.2 pg (ref 26.0–34.0)
MCHC: 31.7 g/dL (ref 30.0–36.0)
MCV: 95.1 fL (ref 78.0–100.0)
Platelets: 183 10*3/uL (ref 150–400)
RBC: 3.05 MIL/uL — ABNORMAL LOW (ref 3.87–5.11)
RDW: 14.5 % (ref 11.5–15.5)
WBC: 7.4 10*3/uL (ref 4.0–10.5)

## 2015-08-13 LAB — GLUCOSE, CAPILLARY
Glucose-Capillary: 107 mg/dL — ABNORMAL HIGH (ref 65–99)
Glucose-Capillary: 116 mg/dL — ABNORMAL HIGH (ref 65–99)

## 2015-08-13 LAB — TISSUE CULTURE
CULTURE: NO GROWTH
GRAM STAIN: NONE SEEN

## 2015-08-13 MED ORDER — OXYCODONE HCL 5 MG PO TABS: 5.0000 mg | ORAL_TABLET | Freq: Four times a day (QID) | ORAL | Status: AC | PRN

## 2015-08-13 NOTE — Discharge Summary (Signed)
Patient ID: Kara Montoya MRN: [id number] DOB/AGE: 1941-04-10 74 y.o.  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 Diabetes mellitus   . Hypertension   . Arthritis     Surgeries: Procedure(s): LEFT TOTAL HIP REVISION on 08/09/2015 - 08/10/2015   Discharged Condition: Improved  Hospital Course: Kara Montoya is an 74 y.o. female who was admitted 08/09/2015 for operative treatment ofMech compl of internal left hip prosthesis, init encntr. Patient has severe unremitting pain that affects sleep, daily activities, and work/hobbies. After pre-op clearance the patient was taken to the operating room on 08/09/2015 - 08/10/2015 and underwent  Procedure(s): LEFT TOTAL HIP REVISION.    Patient was given perioperative antibiotics: Anti-infectives    None       Patient was given sequential compression devices, early ambulation, and chemoprophylaxis to prevent DVT.  Patient benefited maximally from hospital stay and there were no complications.    Recent vital signs: Patient Vitals for the past 24 hrs:  BP Temp Temp src Pulse Resp SpO2  08/13/15 0524 (!) 101/53 mmHg 97.9 F (36.6 C) Oral 80 18 97 %  08/12/15 2041 (!) 110/54 mmHg 98.2 F (36.8 C) Oral 90 16 98 %  08/12/15 1241 (!) 103/44 mmHg 98.1 F (36.7 C) Oral 92 18 100 %     Recent laboratory studies:  Recent Labs  08/11/15 0738 08/12/15 0416 08/13/15 0416  WBC 7.4 9.9 7.4  HGB 11.0* 9.6* 9.2*  HCT 34.9* 30.3* 29.0*  PLT 172 175 183  NA 137  --   --   K 4.3  --   --   CL 103  --   --   CO2 30  --   --   BUN 11  --   --   CREATININE 0.94  --   --   GLUCOSE 128*  --    --   CALCIUM 9.0  --   --      Discharge Medications:     Medication List    STOP taking these medications        aspirin 81 MG chewable tablet  Replaced by:  aspirin EC 325 MG tablet     HYDROcodone-acetaminophen 5-325 MG per tablet  Commonly known as:  NORCO/VICODIN     methocarbamol 500 MG tablet  Commonly known as:  ROBAXIN  Replaced by:  tiZANidine 2 MG tablet      TAKE these medications        aspirin EC 325 MG tablet  Take 1 tablet (325 mg total) by mouth 2 (two) times daily.     D 10000 10000 UNITS Caps  Generic drug:  Cholecalciferol  Take 10,000 Units by mouth once a week. On Wednesdays     etodolac 400 MG tablet  Commonly known as:  LODINE  Take 400 mg by mouth 2 (two) times daily as needed. inflammation     losartan 25 MG tablet  Commonly known as:  COZAAR  Take 25 mg by mouth daily.     metFORMIN 500 MG tablet  Commonly known as:  GLUCOPHAGE  Take 1 tablet (500 mg total) by mouth 2 (two) times  daily with a meal. Resume on August 3     omeprazole 20 MG capsule  Commonly known as:  PRILOSEC  1 tablet twice daily for 2 weeks and then once daily     oxyCODONE 5 MG immediate release tablet  Commonly known as:  Oxy IR/ROXICODONE  Take 1-2 tablets (5-10 mg total) by mouth every 6 (six) hours as needed for severe pain.     pravastatin 10 MG tablet  Commonly known as:  PRAVACHOL  Take 10 mg by mouth daily.     tiZANidine 2 MG tablet  Commonly known as:  ZANAFLEX  Take 1 tablet (2 mg total) by mouth every 8 (eight) hours as needed.     VITATAB MV 2.8 MG Tabs  Take 2 tablets by mouth daily.        Diagnostic Studies: Dg Chest 2 View  07/25/2015   CLINICAL DATA:  Chest pain for 8 hr with diaphoresis.  EXAM: CHEST  2 VIEW  COMPARISON:  05/21/2015  FINDINGS: The cardiomediastinal contours are normal. Mild eventration of right hemidiaphragm and interstitial prominence, unchanged. Pulmonary vasculature is normal. No consolidation, pleural effusion, or  pneumothorax. No acute osseous abnormalities are seen. Mild degenerative change in the mid thoracic spine.  IMPRESSION: Stable chronic change without acute pulmonary process.   Electronically Signed   By: Rubye Oaks M.D.   On: 07/25/2015 00:52   Dg Pelvis Portable  08/10/2015   CLINICAL DATA:  Mechanical loosening of left hip prosthesis. Further evaluation requested. Initial encounter.  EXAM: PORTABLE PELVIS 1-2 VIEWS  COMPARISON:  MRI of the left hip performed 04/20/2014, and CT of the abdomen and pelvis performed 11/21/2011  FINDINGS: [name] left hip prosthesis appears intact, without evidence of loosening. There is no evidence of particle disease. Mild surrounding sclerotic change is noted, somewhat similar to the appearance in 2012.  There is no evidence of fracture or dislocation.  The right hip prosthesis is also grossly unremarkable. Mild degenerative change is noted at the lower lumbar spine. The sacroiliac joints are grossly unremarkable. The visualized bowel gas pattern is within normal limits.  IMPRESSION: Left hip prosthesis appears intact, without evidence of loosening. No evidence of particle disease. Mild surrounding sclerotic change is grossly stable. Right hip prosthesis is also grossly unremarkable.   Electronically Signed   By: Roanna Raider M.D.   On: 08/10/2015 17:14   Chest Portable 1 View  08/09/2015   CLINICAL DATA:  Preoperative imaging for left hip replacement surgery  EXAM: PORTABLE CHEST - 1 VIEW  COMPARISON:  07/25/2015  FINDINGS: The heart size and mediastinal contours are within normal limits. Both lungs are clear. The visualized skeletal structures are unremarkable.  IMPRESSION: No active disease.   Electronically Signed   By: Alcide Clever M.D.   On: 08/09/2015 14:31    Disposition: Home with home health PT.      Discharge Instructions    Call MD / Call 911    Complete by:  As directed   If you experience chest pain or shortness of breath, CALL 911 and be  transported to the hospital emergency room.  If you develope a fever above 101 F, pus (white drainage) or increased drainage or redness at the wound, or calf pain, call your surgeon's office.     Constipation Prevention    Complete by:  As directed   Drink plenty of fluids.  Prune juice may be helpful.  You may use a stool softener, such as Colace (over  the counter) 100 mg twice a day.  Use MiraLax (over the counter) for constipation as needed.     Diet Carb Modified    Complete by:  As directed      Do not sit on low chairs, stoools or toilet seats, as it may be difficult to get up from low surfaces    Complete by:  As directed      Follow the hip precautions as taught in Physical Therapy    Complete by:  As directed      Increase activity slowly as tolerated    Complete by:  As directed      Weight bearing as tolerated    Complete by:  As directed   Laterality:  left  Extremity:  Lower           Follow-up Information    Follow up with Nestor Lewandowsky, MD In 2 weeks.   Specialty:  Orthopedic Surgery   Contact information:   [address] [place] [address] [phone]       Follow up with [facility].   Why:  Someone from [facility] will contact you concerning start date and time for therapy.   Contact information:   [address] [place] [address] [phone]        Signed: Matthew Folks 08/13/2015, 8:54 AM

## 2015-08-13 NOTE — Progress Notes (Signed)
Physical Therapy Treatment Patient Details Name: Kara Montoya MRN: [id number] DOB: [birth year] Today's Date: 08/13/2015    History of Present Illness Revision left total hip arthroplasty     PT Comments    Patient continues to improve slowly with mobility independence.  Low activity tolerance and MIN assist with bed mobility and compliance with hip precautions during transfers.  Family not present for training in AM session, will try again this afternoon.  Patient anticipated to ultimately return to home with family assist, but will benefit from continuation of skilled PT services to continue with strength, transfers, and gait while in hospital.  Follow Up Recommendations  Home health PT;Supervision for mobility/OOB     Equipment Recommendations  None recommended by PT    Recommendations for Other Services       Precautions / Restrictions Precautions Precautions: Posterior Hip Precaution Comments: Reviewed hip precautions Restrictions Weight Bearing Restrictions: Yes LLE Weight Bearing: Weight bearing as tolerated    Mobility  Bed Mobility Overal bed mobility: Needs Assistance Bed Mobility: Sit to Supine     Supine to sit: Min assist     General bed mobility comments: Min A for Montoya LE management  Transfers Overall transfer level: Needs assistance Equipment used: Rolling walker (2 wheeled)   Sit to Stand: Min guard         General transfer comment: Cues for safe technique and hip precautions, increased time  Ambulation/Gait Ambulation/Gait assistance: Supervision Ambulation Distance (Feet): 40 Feet Assistive device: Rolling walker (2 wheeled) Gait Pattern/deviations: Step-to pattern;Decreased step length - left;Decreased step length - right;Decreased stance time - left Gait velocity: decreased   General Gait Details: Slow and antalgic.  R UE soreness due to increased WB in UE's   Stairs            Wheelchair Mobility    Modified Rankin (Stroke  Patients Only)       Balance     Sitting balance-Leahy Scale: Fair Sitting balance - Comments: R lateral lean secondary to pain   Standing balance support: Bilateral upper extremity supported Standing balance-Leahy Scale: Poor                      Cognition Arousal/Alertness: Awake/alert Behavior During Therapy: WFL for tasks assessed/performed Overall Cognitive Status: Within Functional Limits for tasks assessed                      Exercises Total Joint Exercises Ankle Circles/Pumps: AROM;Both;15 reps Heel Slides: AAROM;10 reps;Both Hip ABduction/ADduction: AAROM;Both;10 reps    General Comments        Pertinent Vitals/Pain Pain Assessment: 0-10 Pain Score: 7  Pain Location: Montoya hip Pain Descriptors / Indicators: Aching;Sore Pain Intervention(s): Monitored during session;Repositioned    Home Living                      Prior Function            PT Goals (current goals can now be found in the care plan section) Acute Rehab PT Goals Patient Stated Goal: go home PT Goal Formulation: With patient Time For Goal Achievement: 08/25/15 Potential to Achieve Goals: Good Progress towards PT goals: Progressing toward goals    Frequency  7X/week    PT Plan Current plan remains appropriate    Co-evaluation             End of Session Equipment Utilized During Treatment: Gait belt Activity Tolerance: Patient limited by  fatigue Patient left: in chair;with call bell/phone within reach     Time: 0950-1020 PT Time Calculation (min) (ACUTE ONLY): 30 min  Charges:  $Gait Training: 8-22 mins $Therapeutic Activity: 8-22 mins                    G Codes:      Kara Montoya 09/08/15, 10:47 AM

## 2015-08-13 NOTE — Clinical Social Work Note (Signed)
CSW consult acknowledged:  Clinical Social Worker received consult for SNF placement. PT currently recommending Home Health. Clinical Social Worker will sign off for now as social work intervention is no longer needed. Please consult us again if new need arises.  Thurmond Hildebran, MSW, LCSWA [phone] 08/13/2015 10:15 AM  

## 2015-08-13 NOTE — Progress Notes (Signed)
Subjective: 3 Days Post-Op Procedure(s) (LRB): LEFT TOTAL HIP REVISION (Left) Patient reports pain as moderate. Making progress with physical therapy. Taking by mouth and voiding okay. Positive flatus. No BM yet. Feels like she would do okay to go home with home health physical therapy. No dizziness.    Objective: Vital signs in last 24 hours: Temp:  [97.9 F (36.6 C)-98.2 F (36.8 C)] 97.9 F (36.6 C) (08/20 0524) Pulse Rate:  [80-92] 80 (08/20 0524) Resp:  [16-18] 18 (08/20 0524) BP: (101-110)/(44-54) 101/53 mmHg (08/20 0524) SpO2:  [97 %-100 %] 97 % (08/20 0524)  Intake/Output from previous day: 08/19 0701 - 08/20 0700 In: 840 [P.O.:840] Out: -  Intake/Output this shift:     Recent Labs  08/11/15 0738 08/12/15 0416 08/13/15 0416  HGB 11.0* 9.6* 9.2*    Recent Labs  08/12/15 0416 08/13/15 0416  WBC 9.9 7.4  RBC 3.22* 3.05*  HCT 30.3* 29.0*  PLT 175 183    Recent Labs  08/11/15 0738  NA 137  K 4.3  CL 103  CO2 30  BUN 11  CREATININE 0.94  GLUCOSE 128*  CALCIUM 9.0   No results for input(s): LABPT, INR in the last 72 hours. Left hip exam: Neurovascular intact Sensation intact distally Intact pulses distally Dorsiflexion/Plantar flexion intact Incision: dressing C/D/I Compartment soft  Assessment/Plan: 3 Days Post-Op Procedure(s) (LRB): LEFT TOTAL HIP REVISION (Left) Acute blood loss anemia, asymptomatic. Plan: Weight-bear as tolerated on the left with posterior hip precautions. Up with therapy Discharge home with home health today after physical therapy. Follow-up with Dr. Turner Daniels in 2 weeks.  Dreydon Cardenas G 08/13/2015, 8:48 AM

## 2015-08-15 LAB — ANAEROBIC CULTURE: Gram Stain: NONE SEEN

## 2016-02-24 ENCOUNTER — Emergency Department (HOSPITAL_BASED_OUTPATIENT_CLINIC_OR_DEPARTMENT_OTHER)
Admission: EM | Admit: 2016-02-24 | Discharge: 2016-02-25 | Disposition: A | Discharge status: Home / Self Care | Payer: Medicare Other | Attending: Emergency Medicine | Admitting: Emergency Medicine

## 2016-02-24 DIAGNOSIS — Z79899 Other long term (current) drug therapy: Secondary | ICD-10-CM | POA: Insufficient documentation

## 2016-02-24 DIAGNOSIS — I10 Essential (primary) hypertension: Secondary | ICD-10-CM | POA: Diagnosis not present

## 2016-02-24 DIAGNOSIS — R0981 Nasal congestion: Secondary | ICD-10-CM | POA: Diagnosis present

## 2016-02-24 DIAGNOSIS — F17210 Nicotine dependence, cigarettes, uncomplicated: Secondary | ICD-10-CM | POA: Insufficient documentation

## 2016-02-24 DIAGNOSIS — Z7982 Long term (current) use of aspirin: Secondary | ICD-10-CM | POA: Insufficient documentation

## 2016-02-24 DIAGNOSIS — Z7984 Long term (current) use of oral hypoglycemic drugs: Secondary | ICD-10-CM | POA: Diagnosis not present

## 2016-02-24 DIAGNOSIS — M1990 Unspecified osteoarthritis, unspecified site: Secondary | ICD-10-CM | POA: Insufficient documentation

## 2016-02-24 DIAGNOSIS — J209 Acute bronchitis, unspecified: Secondary | ICD-10-CM | POA: Insufficient documentation

## 2016-02-24 DIAGNOSIS — Z91040 Latex allergy status: Secondary | ICD-10-CM | POA: Insufficient documentation

## 2016-02-24 DIAGNOSIS — Z9889 Other specified postprocedural states: Secondary | ICD-10-CM | POA: Insufficient documentation

## 2016-02-24 DIAGNOSIS — E119 Type 2 diabetes mellitus without complications: Secondary | ICD-10-CM | POA: Diagnosis not present

## 2016-02-25 ENCOUNTER — Emergency Department (HOSPITAL_BASED_OUTPATIENT_CLINIC_OR_DEPARTMENT_OTHER): Payer: Medicare Other

## 2016-02-25 ENCOUNTER — Encounter (HOSPITAL_BASED_OUTPATIENT_CLINIC_OR_DEPARTMENT_OTHER): Payer: Self-pay | Admitting: *Deleted

## 2016-02-25 DIAGNOSIS — I214 Non-ST elevation (NSTEMI) myocardial infarction: Secondary | ICD-10-CM | POA: Diagnosis not present

## 2016-02-25 DIAGNOSIS — R079 Chest pain, unspecified: Secondary | ICD-10-CM | POA: Diagnosis not present

## 2016-02-25 MED ORDER — ALBUTEROL SULFATE HFA 108 (90 BASE) MCG/ACT IN AERS: 2.0000 | INHALATION_SPRAY | RESPIRATORY_TRACT | Status: DC | PRN

## 2016-02-25 MED ORDER — ACETAMINOPHEN 325 MG PO TABS
650.0000 mg | ORAL_TABLET | Freq: Once | ORAL | Status: AC
  Administered 2016-02-25: 650 mg via ORAL
  Filled 2016-02-25: qty 2

## 2016-02-25 MED ORDER — ALBUTEROL SULFATE HFA 108 (90 BASE) MCG/ACT IN AERS
2.0000 | INHALATION_SPRAY | Freq: Once | RESPIRATORY_TRACT | Status: AC
  Administered 2016-02-25: 2 via RESPIRATORY_TRACT
  Filled 2016-02-25: qty 6.7

## 2016-02-25 NOTE — ED Notes (Signed)
Pt. Reports On Tues she started getting a scratchy throat.  Pt. Also reports she had her last cigg. On Monday.  Pt. Has a wet cough with reports of coughing stuff up.  Pt. Is very active and reports a recent cardiac cath with no need for stents.  Resp. Therapist at bedside PanolaJenny listened to Pt. Reports lung snds as clear and diminished.

## 2016-02-25 NOTE — ED Provider Notes (Signed)
CSN: [id number]     Arrival date & time 02/24/16  2342 History   First MD Initiated Contact with Patient 02/25/16 0008     Chief Complaint  Patient presents with  . URI     (Consider location/radiation/quality/duration/timing/severity/associated sxs/prior Treatment) HPI  This is a 75 year old female with a four-day history of scratchy throat, nasal congestion, productive cough and back and abdominal soreness from coughing. She was noted to have a low-grade fever on arrival. Symptoms are moderate. She does not have an inhaler but is a smoker. Respiratory therapy evaluated her and found her to have diminished lung sounds.   Past Medical History  Diagnosis Date  . Diabetes mellitus   . Hypertension   . Arthritis    Past Surgical History  Procedure Laterality Date  . Total hip arthroplasty    . Cholecystectomy  10/15/11  . Cardiac catheterization N/A 07/25/2015    Procedure: Left Heart Cath and Coronary Angiography;  Surgeon: Runell Gess, MD;  Location: [facility] INVASIVE CV LAB;  Service: Cardiovascular;  Laterality: N/A;  . Revision total hip arthroplasty Left 08/10/2015  . Total hip revision Left 08/10/2015    Procedure: LEFT TOTAL HIP REVISION;  Surgeon: Gean Birchwood, MD;  Location: [facility] OR;  Service: Orthopedics;  Laterality: Left;  . Cardiac catheterization      no stents   No family history on file. Social History  Substance Use Topics  . Smoking status: Current Every Day Smoker -- 0.25 packs/day for 30 years    Types: Cigarettes  . Smokeless tobacco: Never Used  . Alcohol Use: No   OB History    No data available     Review of Systems  All other systems reviewed and are negative.   Allergies  Adhesive; Latex; and Morphine and related  Home Medications   Prior to Admission medications   Medication Sig Start Date End Date Taking? Authorizing Provider  aspirin EC 325 MG tablet Take 1 tablet (325 mg total) by mouth 2 (two) times daily. 08/10/15   Allena Katz, PA-C   Cholecalciferol (D 10000) 10000 UNITS CAPS Take 10,000 Units by mouth once a week. On Wednesdays 07/01/15 06/30/16  Historical Provider, MD  etodolac (LODINE) 400 MG tablet Take 400 mg by mouth 2 (two) times daily as needed. inflammation 06/30/15   Historical Provider, MD  losartan (COZAAR) 25 MG tablet Take 25 mg by mouth daily.      Historical Provider, MD  metFORMIN (GLUCOPHAGE) 500 MG tablet Take 1 tablet (500 mg total) by mouth 2 (two) times daily with a meal. Resume on August 3 07/25/15   Osvaldo Shipper, MD  Multiple Vitamins-Minerals-FA (VITATAB MV) 2.8 MG TABS Take 2 tablets by mouth daily.      Historical Provider, MD  omeprazole (PRILOSEC) 20 MG capsule 1 tablet twice daily for 2 weeks and then once daily 07/25/15   Osvaldo Shipper, MD  oxyCODONE (OXY IR/ROXICODONE) 5 MG immediate release tablet Take 1-2 tablets (5-10 mg total) by mouth every 6 (six) hours as needed for severe pain. 08/13/15   Marshia Ly, PA-C  pravastatin (PRAVACHOL) 10 MG tablet Take 10 mg by mouth daily. 08/04/15 08/03/16  Historical Provider, MD  tiZANidine (ZANAFLEX) 2 MG tablet Take 1 tablet (2 mg total) by mouth every 8 (eight) hours as needed. 08/10/15   Allena Katz, PA-C   BP 148/91 mmHg  Pulse 98  Temp(Src) 100 F (37.8 C) (Oral)  Resp 20  Ht  (1.6 m)  Wt  218 lb (98.884 kg)  BMI 38.63 kg/m2  SpO2 95%   Physical Exam General: Well-developed, well-nourished female in no acute distress; appearance consistent with age of record HENT: normocephalic; atraumatic Eyes: pupils equal, round and reactive to light; extraocular muscles intact; arcus senilis bilaterally Neck: supple Heart: regular rate and rhythm; distant sounds Lungs: Decreased air movement bilaterally; faint end expiratory wheezes in bases; frequent rattly cough Abdomen: soft; nondistended; nontender; no masses or hepatosplenomegaly; bowel sounds present Extremities: No deformity; full range of motion; pulses normal Neurologic: Awake, alert and  oriented; motor function intact in all extremities and symmetric; no facial droop Skin: Warm and dry Psychiatric: Normal mood and affect    ED Course  Procedures (including critical care time)   MDM  Nursing notes and vitals signs, including pulse oximetry, reviewed.  Summary of this visit's results, reviewed by myself:  Imaging Studies: Dg Chest 2 View  02/25/2016  CLINICAL DATA:  75 year old female with cough and fever EXAM: CHEST  2 VIEW COMPARISON:  Radiograph dated 08/09/2015 FINDINGS: Two views of the chest demonstrate emphysematous changes of the lungs. There is no focal consolidation, pleural effusion, or pneumothorax. The cardiac silhouette is within normal limits. There is degenerative changes of the spine. No acute osseous pathology. IMPRESSION: No active cardiopulmonary disease. Electronically Signed   By: Elgie CollardArash  Radparvar M.D.   On: 02/25/2016 01:20   1:37 AM Air movement improved and coughing reduced after albuterol MDI treatment.  Paula LibraJohn Shyra Emile, MD 02/25/16 [id number]

## 2016-02-25 NOTE — Discharge Instructions (Signed)

## 2016-02-28 ENCOUNTER — Emergency Department (HOSPITAL_BASED_OUTPATIENT_CLINIC_OR_DEPARTMENT_OTHER): Payer: Medicare Other

## 2016-02-28 ENCOUNTER — Encounter (HOSPITAL_BASED_OUTPATIENT_CLINIC_OR_DEPARTMENT_OTHER): Payer: Self-pay | Admitting: Emergency Medicine

## 2016-02-28 DIAGNOSIS — I472 Ventricular tachycardia: Secondary | ICD-10-CM | POA: Diagnosis not present

## 2016-02-28 DIAGNOSIS — Z6837 Body mass index (BMI) 37.0-37.9, adult: Secondary | ICD-10-CM

## 2016-02-28 DIAGNOSIS — I110 Hypertensive heart disease with heart failure: Secondary | ICD-10-CM | POA: Diagnosis present

## 2016-02-28 DIAGNOSIS — I214 Non-ST elevation (NSTEMI) myocardial infarction: Secondary | ICD-10-CM | POA: Diagnosis present

## 2016-02-28 DIAGNOSIS — E875 Hyperkalemia: Secondary | ICD-10-CM | POA: Diagnosis not present

## 2016-02-28 DIAGNOSIS — Z7984 Long term (current) use of oral hypoglycemic drugs: Secondary | ICD-10-CM | POA: Diagnosis not present

## 2016-02-28 DIAGNOSIS — Z7982 Long term (current) use of aspirin: Secondary | ICD-10-CM

## 2016-02-28 DIAGNOSIS — I200 Unstable angina: Secondary | ICD-10-CM | POA: Diagnosis present

## 2016-02-28 DIAGNOSIS — I469 Cardiac arrest, cause unspecified: Secondary | ICD-10-CM | POA: Diagnosis not present

## 2016-02-28 DIAGNOSIS — E1169 Type 2 diabetes mellitus with other specified complication: Secondary | ICD-10-CM

## 2016-02-28 DIAGNOSIS — I5031 Acute diastolic (congestive) heart failure: Secondary | ICD-10-CM | POA: Diagnosis present

## 2016-02-28 DIAGNOSIS — I2510 Atherosclerotic heart disease of native coronary artery without angina pectoris: Secondary | ICD-10-CM | POA: Diagnosis present

## 2016-02-28 DIAGNOSIS — Z79899 Other long term (current) drug therapy: Secondary | ICD-10-CM | POA: Diagnosis not present

## 2016-02-28 DIAGNOSIS — F17210 Nicotine dependence, cigarettes, uncomplicated: Secondary | ICD-10-CM | POA: Diagnosis present

## 2016-02-28 DIAGNOSIS — R569 Unspecified convulsions: Secondary | ICD-10-CM | POA: Diagnosis not present

## 2016-02-28 DIAGNOSIS — E669 Obesity, unspecified: Secondary | ICD-10-CM | POA: Diagnosis present

## 2016-02-28 DIAGNOSIS — R079 Chest pain, unspecified: Secondary | ICD-10-CM | POA: Diagnosis present

## 2016-02-28 DIAGNOSIS — E119 Type 2 diabetes mellitus without complications: Secondary | ICD-10-CM | POA: Diagnosis present

## 2016-02-28 DIAGNOSIS — I4729 Other ventricular tachycardia: Secondary | ICD-10-CM

## 2016-02-28 DIAGNOSIS — I10 Essential (primary) hypertension: Secondary | ICD-10-CM | POA: Diagnosis present

## 2016-02-28 DIAGNOSIS — J40 Bronchitis, not specified as acute or chronic: Secondary | ICD-10-CM

## 2016-02-28 LAB — TROPONIN I: Troponin I: 0.24 ng/mL — ABNORMAL HIGH (ref <0.031)

## 2016-02-28 LAB — CBC
HCT: 40.4 % (ref 36.0–46.0)
Hemoglobin: 12.8 g/dL (ref 12.0–15.0)
MCH: 30.2 pg (ref 26.0–34.0)
MCHC: 31.7 g/dL (ref 30.0–36.0)
MCV: 95.3 fL (ref 78.0–100.0)
PLATELETS: 180 10*3/uL (ref 150–400)
RBC: 4.24 MIL/uL (ref 3.87–5.11)
RDW: 14.9 % (ref 11.5–15.5)
WBC: 5.9 10*3/uL (ref 4.0–10.5)

## 2016-02-28 LAB — BASIC METABOLIC PANEL
Anion gap: 11 (ref 5–15)
BUN: 19 mg/dL (ref 6–20)
CALCIUM: 8.8 mg/dL — AB (ref 8.9–10.3)
CO2: 24 mmol/L (ref 22–32)
CREATININE: 0.79 mg/dL (ref 0.44–1.00)
Chloride: 108 mmol/L (ref 101–111)
GFR calc non Af Amer: >60 mL/min (ref >60)
Glucose, Bld: 171 mg/dL — ABNORMAL HIGH (ref 65–99)
Potassium: 4.3 mmol/L (ref 3.5–5.1)
Sodium: 143 mmol/L (ref 135–145)

## 2016-02-28 MED ORDER — HEPARIN SODIUM (PORCINE) 5000 UNIT/ML IJ SOLN
INTRAMUSCULAR | Status: AC
  Administered 2016-02-28: 4000 [IU]
  Filled 2016-02-28: qty 1

## 2016-02-28 MED ORDER — HEPARIN BOLUS VIA INFUSION: 4000.0000 [IU] | Freq: Once | INTRAVENOUS | Status: DC

## 2016-02-28 MED ORDER — HEPARIN (PORCINE) IN NACL 100-0.45 UNIT/ML-% IJ SOLN
1000.0000 [IU]/h | INTRAMUSCULAR | Status: DC
  Administered 2016-02-28: 1000 [IU]/h via INTRAVENOUS
  Filled 2016-02-28: qty 250

## 2016-02-28 MED ORDER — NITROGLYCERIN IN D5W 200-5 MCG/ML-% IV SOLN
5.0000 ug/min | Freq: Once | INTRAVENOUS | Status: AC
  Administered 2016-02-28: 5 ug/min via INTRAVENOUS
  Filled 2016-02-28: qty 250

## 2016-02-28 MED ORDER — IPRATROPIUM-ALBUTEROL 0.5-2.5 (3) MG/3ML IN SOLN
3.0000 mL | Freq: Four times a day (QID) | RESPIRATORY_TRACT | Status: DC
  Administered 2016-02-28 – 2016-02-29 (×3): 3 mL via RESPIRATORY_TRACT
  Filled 2016-02-28 (×3): qty 3

## 2016-02-28 MED ORDER — ASPIRIN 81 MG PO CHEW
324.0000 mg | CHEWABLE_TABLET | Freq: Once | ORAL | Status: AC
  Administered 2016-02-28: 324 mg via ORAL
  Filled 2016-02-28: qty 4

## 2016-02-28 MED ORDER — FENTANYL CITRATE (PF) 100 MCG/2ML IJ SOLN
50.0000 ug | Freq: Once | INTRAMUSCULAR | Status: AC
  Administered 2016-02-28: 50 ug via INTRAVENOUS
  Filled 2016-02-28: qty 2

## 2016-02-28 NOTE — ED Provider Notes (Signed)
CSN: [id number]     Arrival date & time 02/27/2016  2026 History  By signing my name below, I, Freida Busman, attest that this documentation has been prepared under the direction and in the presence of Laurence Spates, MD . Electronically Signed: Freida Busman, Scribe. 03/06/2016. 10:03 PM.    Chief Complaint  Patient presents with  . Chest Pain  . Cough   The history is provided by the patient. No language interpreter was used.     HPI Comments:  Kara Montoya is a 75 y.o. female with a history of HTN and DM, who presents to the Emergency Department complaining of constant 10/10 pain across her chest since ~ 1900 today. Pt reports associated persistent cough x 1 week, and mild nasal congestion. She denies sore throat, nausea, vomiting, diaphoresis and SOB. Pt was evaluated in the ED for cough on 02/24/16 and diagnosed with bronchitis.  No alleviating factors noted. She denies use of blood thinners. Pt is a smoker.   Of note, she had heart catheterization in August 2016 which was normal.   Past Medical History  Diagnosis Date  . Diabetes mellitus   . Hypertension   . Arthritis    Past Surgical History  Procedure Laterality Date  . Total hip arthroplasty    . Cholecystectomy  10/15/11  . Cardiac catheterization N/A 07/25/2015    Procedure: Left Heart Cath and Coronary Angiography;  Surgeon: Runell Gess, MD;  Location: [facility] INVASIVE CV LAB;  Service: Cardiovascular;  Laterality: N/A;  . Revision total hip arthroplasty Left 08/10/2015  . Total hip revision Left 08/10/2015    Procedure: LEFT TOTAL HIP REVISION;  Surgeon: Gean Birchwood, MD;  Location: [facility] OR;  Service: Orthopedics;  Laterality: Left;  . Cardiac catheterization      no stents   No family history on file. Social History  Substance Use Topics  . Smoking status: Current Every Day Smoker -- 0.25 packs/day for 30 years    Types: Cigarettes  . Smokeless tobacco: Never Used  . Alcohol Use: No   OB History    No data  available     Review of Systems 10 systems reviewed and all are negative for acute change except as noted in the HPI.  Allergies  Adhesive; Latex; and Morphine and related  Home Medications   Prior to Admission medications   Medication Sig Start Date End Date Taking? Authorizing Provider  albuterol (PROVENTIL) (2.5 MG/3ML) 0.083% nebulizer solution Take 2.5 mg by nebulization every 6 (six) hours as needed for wheezing or shortness of breath.   Yes Historical Provider, MD  aspirin EC 325 MG tablet Take 1 tablet (325 mg total) by mouth 2 (two) times daily. 08/10/15   Allena Katz, PA-C  Cholecalciferol (D 10000) 10000 UNITS CAPS Take 10,000 Units by mouth once a week. On Wednesdays 07/01/15 06/30/16  Historical Provider, MD  etodolac (LODINE) 400 MG tablet Take 400 mg by mouth 2 (two) times daily as needed. inflammation 06/30/15   Historical Provider, MD  losartan (COZAAR) 25 MG tablet Take 25 mg by mouth daily.      Historical Provider, MD  metFORMIN (GLUCOPHAGE) 500 MG tablet Take 1 tablet (500 mg total) by mouth 2 (two) times daily with a meal. Resume on August 3 07/25/15   Osvaldo Shipper, MD  Multiple Vitamins-Minerals-FA (VITATAB MV) 2.8 MG TABS Take 2 tablets by mouth daily.      Historical Provider, MD  omeprazole (PRILOSEC) 20 MG capsule 1 tablet  twice daily for 2 weeks and then once daily 07/25/15   Osvaldo ShipperGokul Krishnan, MD  oxyCODONE (OXY IR/ROXICODONE) 5 MG immediate release tablet Take 1-2 tablets (5-10 mg total) by mouth every 6 (six) hours as needed for severe pain. 08/13/15   Marshia LyJames Bethune, PA-C  pravastatin (PRAVACHOL) 10 MG tablet Take 10 mg by mouth daily. 08/04/15 08/03/16  Historical Provider, MD  tiZANidine (ZANAFLEX) 2 MG tablet Take 1 tablet (2 mg total) by mouth every 8 (eight) hours as needed. 08/10/15   Allena KatzEric K Phillips, PA-C   BP 145/80 mmHg  Pulse 84  Temp(Src) 98.5 F (36.9 C) (Oral)  Resp 14  Ht 5\' 3"  (1.6 m)  Wt 212 lb (96.163 kg)  BMI 37.56 kg/m2  SpO2 96% Physical Exam   Constitutional: She is oriented to person, place, and time. She appears well-developed and well-nourished. No distress.  HENT:  Head: Normocephalic and atraumatic.  Mouth/Throat: Oropharynx is clear and moist.  Moist mucous membranes  Eyes: Conjunctivae are normal. Pupils are equal, round, and reactive to light.  Neck: Neck supple.  Cardiovascular: Normal rate, regular rhythm and normal heart sounds.   No murmur heard. Pulmonary/Chest: Effort normal. No respiratory distress. She has wheezes.  Inspiratory and expiratory wheezing bilaterally Intermittent cough   Abdominal: Soft. Bowel sounds are normal. She exhibits no distension. There is no tenderness.  Musculoskeletal: She exhibits no edema.  Neurological: She is alert and oriented to person, place, and time.  Fluent speech  Skin: Skin is warm and dry.  Psychiatric: She has a normal mood and affect. Judgment normal.  Nursing note and vitals reviewed.   ED Course  .Critical Care Performed by: Laurence SpatesLITTLE, RACHEL MORGAN Authorized by: Laurence SpatesLITTLE, RACHEL MORGAN Total critical care time: 60 minutes Critical care time was exclusive of separately billable procedures and treating other patients. Critical care was necessary to treat or prevent imminent or life-threatening deterioration of the following conditions: cardiac failure. Critical care was time spent personally by me on the following activities: development of treatment plan with patient or surrogate, discussions with consultants, evaluation of patient's response to treatment, examination of patient, obtaining history from patient or surrogate, ordering and performing treatments and interventions, ordering and review of laboratory studies, re-evaluation of patient's condition and review of old charts.     DIAGNOSTIC STUDIES:  Oxygen Saturation is 98% on RA, normal by my interpretation.    COORDINATION OF CARE:  9:56 PM Pt declined NTG, will order pain meds and breathing treatment. Pt  [facility].  Discussed treatment plan with pt at bedside and pt agreed to plan.  Labs Review Labs Reviewed  BASIC METABOLIC PANEL - Abnormal; Notable for the following:    Glucose, Bld 171 (*)    Calcium 8.8 (*)    All other components within normal limits  TROPONIN I - Abnormal; Notable for the following:    Troponin I 0.24 (*)    All other components within normal limits  CBC  HEPARIN LEVEL (UNFRACTIONATED)  HEPARIN LEVEL (UNFRACTIONATED)    Imaging Review Dg Chest 2 View  03/03/2016  CLINICAL DATA:  Chest pain today. Cough, congestion and scratchy throat for 1 week. EXAM: CHEST  2 VIEW COMPARISON:  Radiographs 3 days prior 02/25/2016 FINDINGS: The cardiomediastinal contours are normal. Emphysematous change again seen. Superimposed bronchial thickening, new from prior. Pulmonary vasculature is normal. No consolidation, pleural effusion, or pneumothorax. No acute osseous abnormalities are seen. IMPRESSION: New bronchial thickening consistent with acute bronchitis. Electronically Signed  By: Rubye Oaks M.D.   On: 03/15/2016 21:37   I have personally reviewed and evaluated these lab results as part of my medical decision-making.   EKG Interpretation   Date/Time:  Tuesday February 28 2016 22:02:10 EST Ventricular Rate:  87 PR Interval:  188 QRS Duration: 81 QT Interval:  360 QTC Calculation: 433 R Axis:   58 Text Interpretation:  Sinus rhythm Posterior infarct, acute (LCx) ST  elevation, consider inferior injury Lateral leads are also involved ST  depression V1-V3, suggest recording posterior leads T wave inversions in  V1-V3, noted on EKG 2016 Confirmed by LITTLE MD, RACHEL [phone]) on  03/03/2016 10:12:27 PM      MDM   Final diagnoses:  NSTEMI (non-ST elevated myocardial infarction) (HCC)  Bronchitis   Patient with recent diagnosis of bronchitis presents with chest pain that began around 7 PM today in the setting of persistent cough. On exam, she was  intermittently coughing, uncomfortable but in no acute distress. Vital signs notable for mild hypertension. On exam, she had scattered wheezes bilaterally. Gave the patient a DuoNeb and aspirin. EKG shows subtle depressions in V1 through V3 with J-point elevation in inferior leads, concerning for ischemia. Repeat EKG shows similar findings. Old EKG shows T-wave inversions throughout all precordial leads, thus difficult to interpret changes. I consulted STEMI physician, Dr. Tresa Endo, who reviewed EKGs and recommended NSTEMI treatment w/ nitro ggt, heparin ggt, and transfer to [facility]. I discussed with admitting cardiologist, Dr. Loney Loh, who has accepted pt. No stepdown beds are currently available, therefore discussed ED-to-ED transfer with Dr. Corlis Leak, who is in agreement with plan. On reexamination, the patient is comfortable and stable vital signs. She endorses mild left-sided chest pain; we will up titrate the nitroglycerin drip. Pt to be transferred to [facility] ED to await stepdown bed placement.   I personally performed the services described in this documentation, which was scribed in my presence. The recorded information has been reviewed and is accurate.    Laurence Spates, MD 03/08/2016 [phone]

## 2016-02-28 NOTE — Progress Notes (Signed)
ANTICOAGULATION CONSULT NOTE - Initial Consult  Pharmacy Consult for Heparin  Indication: chest pain/ACS  Allergies  Allergen Reactions  . Adhesive [Tape] Other (See Comments)    Pt states it removes her skin  . Latex Itching  . Morphine And Related     Sweaty, suppresses breathing    Patient Measurements: Height: 5\' 3"  (160 cm) Weight: 212 lb (96.163 kg) IBW/kg (Calculated) : 52.4  Vital Signs: Temp: 98.5 F (36.9 C) (03/07 2032) Temp Source: Oral (03/07 2032) BP: 151/84 mmHg (03/07 2132) Pulse Rate: 70 (03/07 2132)  Labs:  Recent Labs  03/15/2016 2050  HGB 12.8  HCT 40.4  PLT 180  CREATININE 0.79  TROPONINI 0.24*    Estimated Creatinine Clearance: 67 mL/min (by C-G formula based on Cr of 0.79).   Medical History: Past Medical History  Diagnosis Date  . Diabetes mellitus   . Hypertension   . Arthritis       Assessment: 74 yof admitted 03/21/2016  with CP. Pharmacy consulted to dose heparin for ACS/CP  No AC pta. Hgb 12.8, PLT 180  Goal of Therapy:  Heparin level 0.3-0.7 units/ml Monitor platelets by anticoagulation protocol: Yes   Plan:  Heparin 4,000 unit bolus Heparin 1000 units/hr 8hr HL Daily HL/CBC  Monitor for s/s of bleeding   Cheryn Lundquist C. Marvis MoellerMiles, PharmD Pharmacy Resident  Pager: [phone] 02/27/2016 10:13 PM

## 2016-02-28 NOTE — ED Notes (Signed)
MD at bedside. 

## 2016-02-28 NOTE — ED Notes (Signed)
Patient transported to X-ray 

## 2016-02-28 NOTE — ED Notes (Signed)
C/o centralized chest pain with pressure beginning tonight. States she was recently treated here for bronchitis and was feeling better still has productive cough. Reports 10/10 pain.

## 2016-02-29 ENCOUNTER — Encounter (HOSPITAL_COMMUNITY): Admission: EM | Disposition: E | Payer: Self-pay | Source: Home / Self Care | Attending: Cardiology

## 2016-02-29 ENCOUNTER — Encounter (HOSPITAL_COMMUNITY): Payer: Self-pay | Admitting: *Deleted

## 2016-02-29 DIAGNOSIS — I214 Non-ST elevation (NSTEMI) myocardial infarction: Principal | ICD-10-CM

## 2016-02-29 DIAGNOSIS — I200 Unstable angina: Secondary | ICD-10-CM | POA: Diagnosis present

## 2016-02-29 DIAGNOSIS — E119 Type 2 diabetes mellitus without complications: Secondary | ICD-10-CM

## 2016-02-29 DIAGNOSIS — I472 Ventricular tachycardia: Secondary | ICD-10-CM

## 2016-02-29 DIAGNOSIS — I2510 Atherosclerotic heart disease of native coronary artery without angina pectoris: Secondary | ICD-10-CM

## 2016-02-29 DIAGNOSIS — I4729 Other ventricular tachycardia: Secondary | ICD-10-CM

## 2016-02-29 DIAGNOSIS — I10 Essential (primary) hypertension: Secondary | ICD-10-CM

## 2016-02-29 DIAGNOSIS — E669 Obesity, unspecified: Secondary | ICD-10-CM

## 2016-02-29 HISTORY — PX: CARDIAC CATHETERIZATION: SHX172

## 2016-02-29 LAB — GLUCOSE, CAPILLARY
GLUCOSE-CAPILLARY: 139 mg/dL — AB (ref 65–99)
GLUCOSE-CAPILLARY: 140 mg/dL — AB (ref 65–99)
Glucose-Capillary: 130 mg/dL — ABNORMAL HIGH (ref 65–99)

## 2016-02-29 LAB — COMPREHENSIVE METABOLIC PANEL
ALBUMIN: 3.1 g/dL — AB (ref 3.5–5.0)
ALT: 22 U/L (ref 14–54)
AST: 102 U/L — AB (ref 15–41)
Alkaline Phosphatase: 96 U/L (ref 38–126)
Anion gap: 11 (ref 5–15)
BILIRUBIN TOTAL: 0.5 mg/dL (ref 0.3–1.2)
BUN: 13 mg/dL (ref 6–20)
CHLORIDE: 111 mmol/L (ref 101–111)
CO2: 23 mmol/L (ref 22–32)
CREATININE: 0.77 mg/dL (ref 0.44–1.00)
Calcium: 8.9 mg/dL (ref 8.9–10.3)
GFR calc Af Amer: >60 mL/min (ref >60)
GLUCOSE: 160 mg/dL — AB (ref 65–99)
POTASSIUM: 4 mmol/L (ref 3.5–5.1)
Sodium: 145 mmol/L (ref 135–145)
TOTAL PROTEIN: 6.4 g/dL — AB (ref 6.5–8.1)

## 2016-02-29 LAB — CBC
HEMATOCRIT: 36.8 % (ref 36.0–46.0)
HEMOGLOBIN: 11.3 g/dL — AB (ref 12.0–15.0)
MCH: 28.8 pg (ref 26.0–34.0)
MCHC: 30.7 g/dL (ref 30.0–36.0)
MCV: 93.9 fL (ref 78.0–100.0)
PLATELETS: 169 10*3/uL (ref 150–400)
RBC: 3.92 MIL/uL (ref 3.87–5.11)
RDW: 15 % (ref 11.5–15.5)
WBC: 5.3 10*3/uL (ref 4.0–10.5)

## 2016-02-29 LAB — HEPARIN LEVEL (UNFRACTIONATED)
Heparin Unfractionated: <0.1 IU/mL — ABNORMAL LOW (ref 0.30–0.70)
Heparin Unfractionated: 0.37 IU/mL (ref 0.30–0.70)

## 2016-02-29 LAB — TSH: TSH: 4.765 u[IU]/mL — ABNORMAL HIGH (ref 0.350–4.500)

## 2016-02-29 LAB — TROPONIN I
TROPONIN I: 30.05 ng/mL — AB (ref <0.031)
Troponin I: 12.04 ng/mL (ref <0.031)
Troponin I: 23.16 ng/mL (ref <0.031)

## 2016-02-29 LAB — PROTIME-INR
INR: 1.09 (ref 0.00–1.49)
PROTHROMBIN TIME: 14.3 s (ref 11.6–15.2)

## 2016-02-29 LAB — MRSA PCR SCREENING: MRSA by PCR: NEGATIVE

## 2016-02-29 SURGERY — LEFT HEART CATH AND CORONARY ANGIOGRAPHY

## 2016-02-29 MED ORDER — PANTOPRAZOLE SODIUM 40 MG PO TBEC
40.0000 mg | DELAYED_RELEASE_TABLET | Freq: Every day | ORAL | Status: DC
  Administered 2016-02-29 – 2016-03-01 (×2): 40 mg via ORAL
  Filled 2016-02-29 (×2): qty 1

## 2016-02-29 MED ORDER — FENTANYL CITRATE (PF) 100 MCG/2ML IJ SOLN
INTRAMUSCULAR | Status: AC
  Filled 2016-02-29: qty 2

## 2016-02-29 MED ORDER — ALBUTEROL SULFATE (2.5 MG/3ML) 0.083% IN NEBU: 2.5000 mg | INHALATION_SOLUTION | Freq: Four times a day (QID) | RESPIRATORY_TRACT | Status: DC | PRN

## 2016-02-29 MED ORDER — HEPARIN SODIUM (PORCINE) 1000 UNIT/ML IJ SOLN
INTRAMUSCULAR | Status: DC | PRN
  Administered 2016-02-29: 5000 [IU] via INTRAVENOUS

## 2016-02-29 MED ORDER — NITROGLYCERIN IN D5W 200-5 MCG/ML-% IV SOLN: 5.0000 ug/min | INTRAVENOUS | Status: DC

## 2016-02-29 MED ORDER — ASPIRIN 81 MG PO CHEW
81.0000 mg | CHEWABLE_TABLET | Freq: Every day | ORAL | Status: DC
  Administered 2016-03-01: 81 mg via ORAL
  Filled 2016-02-29: qty 1

## 2016-02-29 MED ORDER — ATORVASTATIN CALCIUM 80 MG PO TABS: 80.0000 mg | ORAL_TABLET | Freq: Every day | ORAL | Status: DC

## 2016-02-29 MED ORDER — NITROGLYCERIN 1 MG/10 ML FOR IR/CATH LAB: 50.0000 ug | Freq: Once | INTRA_ARTERIAL | Status: DC

## 2016-02-29 MED ORDER — NITROGLYCERIN IN D5W 200-5 MCG/ML-% IV SOLN
INTRAVENOUS | Status: AC
  Filled 2016-02-29: qty 250

## 2016-02-29 MED ORDER — HEPARIN (PORCINE) IN NACL 100-0.45 UNIT/ML-% IJ SOLN
1250.0000 [IU]/h | INTRAMUSCULAR | Status: DC
  Administered 2016-02-29: 1000 [IU]/h via INTRAVENOUS
  Administered 2016-03-01: 1250 [IU]/h via INTRAVENOUS
  Filled 2016-02-29: qty 250

## 2016-02-29 MED ORDER — NITROGLYCERIN 1 MG/10 ML FOR IR/CATH LAB
INTRA_ARTERIAL | Status: DC | PRN
  Administered 2016-02-29: 11:00:00

## 2016-02-29 MED ORDER — IPRATROPIUM-ALBUTEROL 0.5-2.5 (3) MG/3ML IN SOLN
3.0000 mL | Freq: Two times a day (BID) | RESPIRATORY_TRACT | Status: DC
  Administered 2016-02-29 – 2016-03-01 (×2): 3 mL via RESPIRATORY_TRACT
  Filled 2016-02-29 (×2): qty 3

## 2016-02-29 MED ORDER — SODIUM CHLORIDE 0.9 % IV SOLN
INTRAVENOUS | Status: DC
  Administered 2016-02-29: 09:00:00 via INTRAVENOUS

## 2016-02-29 MED ORDER — SODIUM CHLORIDE 0.9% FLUSH: 3.0000 mL | Freq: Two times a day (BID) | INTRAVENOUS | Status: DC

## 2016-02-29 MED ORDER — ALBUTEROL SULFATE (2.5 MG/3ML) 0.083% IN NEBU
2.5000 mg | INHALATION_SOLUTION | RESPIRATORY_TRACT | Status: DC | PRN
  Administered 2016-03-01: 2.5 mg via RESPIRATORY_TRACT
  Filled 2016-02-29: qty 3

## 2016-02-29 MED ORDER — NITROGLYCERIN 0.4 MG SL SUBL
SUBLINGUAL_TABLET | SUBLINGUAL | Status: AC
  Administered 2016-02-29: 0.4 mg via SUBLINGUAL
  Filled 2016-02-29: qty 1

## 2016-02-29 MED ORDER — ZOLPIDEM TARTRATE 5 MG PO TABS: 5.0000 mg | ORAL_TABLET | Freq: Every evening | ORAL | Status: DC | PRN

## 2016-02-29 MED ORDER — SODIUM CHLORIDE 0.9% FLUSH
3.0000 mL | Freq: Two times a day (BID) | INTRAVENOUS | Status: DC
Start: 2016-02-29 — End: 2016-03-01
  Administered 2016-02-29: 3 mL via INTRAVENOUS

## 2016-02-29 MED ORDER — SODIUM CHLORIDE 0.9% FLUSH
3.0000 mL | Freq: Two times a day (BID) | INTRAVENOUS | Status: DC
Start: 2016-02-29 — End: 2016-03-01
  Administered 2016-02-29 – 2016-03-01 (×2): 3 mL via INTRAVENOUS

## 2016-02-29 MED ORDER — HEPARIN (PORCINE) IN NACL 2-0.9 UNIT/ML-% IJ SOLN
INTRAMUSCULAR | Status: AC
  Filled 2016-02-29: qty 1000

## 2016-02-29 MED ORDER — CLOPIDOGREL BISULFATE 75 MG PO TABS
75.0000 mg | ORAL_TABLET | Freq: Every day | ORAL | Status: DC
  Administered 2016-03-01: 75 mg via ORAL
  Filled 2016-02-29: qty 1

## 2016-02-29 MED ORDER — CLOPIDOGREL BISULFATE 75 MG PO TABS
75.0000 mg | ORAL_TABLET | Freq: Every day | ORAL | Status: DC
  Administered 2016-02-29: 75 mg via ORAL

## 2016-02-29 MED ORDER — NITROGLYCERIN 1 MG/10 ML FOR IR/CATH LAB
INTRA_ARTERIAL | Status: AC
Start: 2016-02-29 — End: 2016-02-29
  Filled 2016-02-29: qty 10

## 2016-02-29 MED ORDER — ONDANSETRON HCL 4 MG/2ML IJ SOLN: 4.0000 mg | Freq: Four times a day (QID) | INTRAMUSCULAR | Status: DC | PRN

## 2016-02-29 MED ORDER — SODIUM CHLORIDE 0.9 % WEIGHT BASED INFUSION: 1.0000 mL/kg/h | INTRAVENOUS | Status: DC

## 2016-02-29 MED ORDER — IOHEXOL 350 MG/ML SOLN
INTRAVENOUS | Status: DC | PRN
  Administered 2016-02-29: 95 mL via INTRAVENOUS

## 2016-02-29 MED ORDER — METOPROLOL TARTRATE 25 MG PO TABS
25.0000 mg | ORAL_TABLET | Freq: Two times a day (BID) | ORAL | Status: DC
  Administered 2016-02-29 – 2016-03-01 (×2): 25 mg via ORAL
  Filled 2016-02-29 (×3): qty 1

## 2016-02-29 MED ORDER — LIDOCAINE HCL (PF) 1 % IJ SOLN
INTRAMUSCULAR | Status: AC
  Filled 2016-02-29: qty 30

## 2016-02-29 MED ORDER — SODIUM CHLORIDE 0.9% FLUSH: 3.0000 mL | INTRAVENOUS | Status: DC | PRN

## 2016-02-29 MED ORDER — VERAPAMIL HCL 2.5 MG/ML IV SOLN
INTRAVENOUS | Status: DC | PRN
  Administered 2016-02-29: 11:00:00 via INTRA_ARTERIAL

## 2016-02-29 MED ORDER — METOPROLOL TARTRATE 12.5 MG HALF TABLET
12.5000 mg | ORAL_TABLET | Freq: Two times a day (BID) | ORAL | Status: DC
  Administered 2016-02-29: 12.5 mg via ORAL
  Filled 2016-02-29: qty 1

## 2016-02-29 MED ORDER — SODIUM CHLORIDE 0.9 % IV SOLN: 250.0000 mL | INTRAVENOUS | Status: DC | PRN

## 2016-02-29 MED ORDER — MIDAZOLAM HCL 2 MG/2ML IJ SOLN
INTRAMUSCULAR | Status: AC
  Filled 2016-02-29: qty 2

## 2016-02-29 MED ORDER — HEPARIN SODIUM (PORCINE) 1000 UNIT/ML IJ SOLN
INTRAMUSCULAR | Status: AC
  Filled 2016-02-29: qty 1

## 2016-02-29 MED ORDER — LOSARTAN POTASSIUM 25 MG PO TABS
25.0000 mg | ORAL_TABLET | Freq: Every day | ORAL | Status: DC
  Administered 2016-02-29 – 2016-03-01 (×2): 25 mg via ORAL
  Filled 2016-02-29 (×2): qty 1

## 2016-02-29 MED ORDER — ACETAMINOPHEN 325 MG PO TABS: 650.0000 mg | ORAL_TABLET | ORAL | Status: DC | PRN

## 2016-02-29 MED ORDER — NITROGLYCERIN IN D5W 200-5 MCG/ML-% IV SOLN
0.0000 ug/min | INTRAVENOUS | Status: DC
  Administered 2016-02-29: 30 ug/min via INTRAVENOUS

## 2016-02-29 MED ORDER — ASPIRIN 81 MG PO CHEW: 81.0000 mg | CHEWABLE_TABLET | Freq: Every day | ORAL | Status: DC

## 2016-02-29 MED ORDER — ALPRAZOLAM 0.25 MG PO TABS: 0.2500 mg | ORAL_TABLET | Freq: Two times a day (BID) | ORAL | Status: DC | PRN

## 2016-02-29 MED ORDER — NITROGLYCERIN 1 MG/10 ML FOR IR/CATH LAB
INTRA_ARTERIAL | Status: DC | PRN
  Administered 2016-02-29: 200 ug via INTRACORONARY

## 2016-02-29 MED ORDER — OXYCODONE HCL 5 MG PO TABS
5.0000 mg | ORAL_TABLET | Freq: Four times a day (QID) | ORAL | Status: DC | PRN
  Administered 2016-02-29: 07:00:00 5 mg via ORAL
  Administered 2016-02-29: 10 mg via ORAL
  Filled 2016-02-29: qty 1
  Filled 2016-02-29: qty 2

## 2016-02-29 MED ORDER — ACETAMINOPHEN 325 MG PO TABS
650.0000 mg | ORAL_TABLET | ORAL | Status: DC | PRN
  Administered 2016-03-01 (×2): 650 mg via ORAL
  Filled 2016-02-29 (×2): qty 2

## 2016-02-29 MED ORDER — VERAPAMIL HCL 2.5 MG/ML IV SOLN
INTRAVENOUS | Status: AC
  Filled 2016-02-29: qty 2

## 2016-02-29 MED ORDER — FENTANYL CITRATE (PF) 100 MCG/2ML IJ SOLN
50.0000 ug | Freq: Once | INTRAMUSCULAR | Status: AC
  Administered 2016-02-29: 04:00:00 50 ug via INTRAVENOUS

## 2016-02-29 MED ORDER — INSULIN ASPART 100 UNIT/ML ~~LOC~~ SOLN
0.0000 [IU] | Freq: Every day | SUBCUTANEOUS | Status: DC
Start: 2016-02-29 — End: 2016-03-01

## 2016-02-29 MED ORDER — CLOPIDOGREL BISULFATE 75 MG PO TABS
ORAL_TABLET | ORAL | Status: AC
  Filled 2016-02-29: qty 1

## 2016-02-29 MED ORDER — MIDAZOLAM HCL 2 MG/2ML IJ SOLN
INTRAMUSCULAR | Status: DC | PRN
  Administered 2016-02-29: 1 mg via INTRAVENOUS

## 2016-02-29 MED ORDER — TIZANIDINE HCL 2 MG PO TABS: 2.0000 mg | ORAL_TABLET | Freq: Three times a day (TID) | ORAL | Status: DC | PRN

## 2016-02-29 MED ORDER — FENTANYL CITRATE (PF) 100 MCG/2ML IJ SOLN
INTRAMUSCULAR | Status: DC | PRN
  Administered 2016-02-29: 50 ug via INTRAVENOUS

## 2016-02-29 MED ORDER — INSULIN ASPART 100 UNIT/ML ~~LOC~~ SOLN
0.0000 [IU] | Freq: Three times a day (TID) | SUBCUTANEOUS | Status: DC
  Administered 2016-03-01: 1 [IU] via SUBCUTANEOUS

## 2016-02-29 SURGICAL SUPPLY — 9 items
CATH INFINITI 5 FR JL3.5 (CATHETERS) ×3 IMPLANT
CATH INFINITI JR4 5F (CATHETERS) ×3 IMPLANT
DEVICE RAD COMP TR BAND LRG (VASCULAR PRODUCTS) ×3 IMPLANT
GLIDESHEATH SLEND A-KIT 6F 22G (SHEATH) ×3 IMPLANT
KIT HEART LEFT (KITS) ×3 IMPLANT
PACK CARDIAC CATHETERIZATION (CUSTOM PROCEDURE TRAY) ×3 IMPLANT
TRANSDUCER W/STOPCOCK (MISCELLANEOUS) ×3 IMPLANT
TUBING CIL FLEX 10 FLL-RA (TUBING) ×3 IMPLANT
WIRE SAFE-T 1.5MM-J .035X260CM (WIRE) ×2 IMPLANT

## 2016-02-29 NOTE — Progress Notes (Signed)
TR BAND REMOVAL  LOCATION:    Radial  Rt wrist  DEFLATED PER PROTOCOL:   yes  TIME BAND OFF / DRESSING APPLIED:    1500, small tegaderm  SITE UPON ARRIVAL:    Level  0  SITE AFTER BAND REMOVAL:    Level 0  CIRCULATION SENSATION AND MOVEMENT:    Within Normal Limits :  yes  COMMENTS:

## 2016-02-29 NOTE — ED Notes (Signed)
[facility]  The patient was seen on Monday for Bronchitis and discharged.  The patient is alert and oriented x4.  Tonight, she began having chest pain at 1900 that started in the left and went to the right of her chest.  She was sinus rhythm on the monitor but her troponin was 0.24 so she was diagnosed with an NSTEMI but the X-ray showed bronchitis.  She was given a 4000unit bolus of heparin and started on a heparin drip at 1000u/hr.  She also has nitroglycerin at 20mcg.  Her pain is still a 5/10.  She has two peripherals a 20 gauge in the left [facility] and a 20 gauge in the right AC.  [facility]'s VS were 128/83, HR80, RR 16 and her sats were 96% on room air.  Her BS was 171.

## 2016-02-29 NOTE — Progress Notes (Signed)
Resting quietly. Taking bites of graham cracker w/PB. Waiting on TCU bed assignment.

## 2016-02-29 NOTE — Progress Notes (Addendum)
  Subjective:  No chest pain this am.  Objective:  Vital Signs in the last 24 hours: Temp:  [98.2 F (36.8 C)-98.8 F (37.1 C)] 98.2 F (36.8 C) (03/08 0801) Pulse Rate:  [62-93] 93 (03/08 0801) Resp:  [10-19] 17 (03/08 0801) BP: (113-161)/(60-87) 113/60 mmHg (03/08 0801) SpO2:  [94 %-100 %] 95 % (03/08 0809) Weight:  [211 lb 13.8 oz (96.1 kg)-212 lb (96.163 kg)] 211 lb 13.8 oz (96.1 kg) (03/08 0315)  Intake/Output from previous day:  Intake/Output Summary (Last 24 hours) at 03/19/2016 0849 Last data filed at 02/28/2016 0600  Gross per 24 hour  Intake 171.76 ml  Output    300 ml  Net -128.24 ml    Physical Exam: General appearance: alert, cooperative, no distress and mildly obese Lungs: clear to auscultation bilaterally Heart: regular rate and rhythm Extremities: no edema, no calf tenderness Skin: Skin color, texture, turgor normal. No rashes or lesions Neurologic: Grossly normal   ROS: She denies hemoptysis, calf pain, or history of blood clots. No recent long trips.    Rate: 66  Rhythm: normal sinus rhythm-6 beats NSVT  Lab Results:  Recent Labs  03/09/2016 2050 03/18/2016 0454  WBC 5.9 5.3  HGB 12.8 11.3*  PLT 180 169    Recent Labs  03/17/2016 2050 02/22/2016 0454  NA 143 145  K 4.3 4.0  CL 108 111  CO2 24 23  GLUCOSE 171* 160*  BUN 19 13  CREATININE 0.79 0.77    Recent Labs  02/24/2016 2050 02/24/2016 0454  TROPONINI 0.24* 12.04*   No results for input(s): INR in the last 72 hours.  Scheduled Meds: . [START ON 03/15/2016] aspirin  81 mg Oral Daily  . atorvastatin  80 mg Oral q1800  . heparin  4,000 Units Intravenous Once  . insulin aspart  0-5 Units Subcutaneous QHS  . insulin aspart  0-9 Units Subcutaneous TID WC  . ipratropium-albuterol  3 mL Nebulization BID  . losartan  25 mg Oral Daily  . metoprolol tartrate  12.5 mg Oral BID  . pantoprazole  40 mg Oral Daily  . sodium chloride flush  3 mL Intravenous Q12H   Continuous Infusions: .  heparin 1,000 Units/hr (03/13/2016 0600)  . nitroGLYCERIN 50 mcg/min (03/04/2016 0600)   PRN Meds:.acetaminophen, albuterol, ALPRAZolam, ondansetron (ZOFRAN) IV, oxyCODONE, zolpidem   Imaging: Dg Chest 2 View  03/04/2016  CLINICAL DATA:  Chest pain today. Cough, congestion and scratchy throat for 1 week. EXAM: CHEST  2 VIEW COMPARISON:  Radiographs 3 days prior 02/25/2016 FINDINGS: The cardiomediastinal contours are normal. Emphysematous change again seen. Superimposed bronchial thickening, new from prior. Pulmonary vasculature is normal. No consolidation, pleural effusion, or pneumothorax. No acute osseous abnormalities are seen. IMPRESSION: New bronchial thickening consistent with acute bronchitis. Electronically Signed   By: Melanie  Ehinger M.D.   On: 03/09/2016 21:37    Cardiac Studies: Cath Aug 2016 Normal coronaries, normal LVF  Assessment/Plan:  74 y/o AA woman with with a history of HTN and DM, seen by Dr Ross in Aug 2016 when she was admitted with [place] (Troponin negative but chest pain and abnormal EKG). Cath done 07/25/15 revealed normal coronaries and normal LVF. She was treated as GI pain. She is admitted now with a NSTEMI-Troponin 12. She had a URI "bronchitis over the past week. She developed 10/10 chest pain and presented to ED in [place]- transferred here for further evaluation.   Principal Problem:   NSTEMI (non-ST elevated myocardial infarction) (HCC) Active Problems:     Unstable angina (HCC)   NSVT    Diabetes mellitus type 2 in obese (HCC)   Essential hypertension  PLAN: Stable currently on Heparin and NTG. Cath today. Add low dose beta blocker. Cath orders written.   Luke Kilroy PA-C 03/12/2016, 8:49 AM [phone]  I have seen, examined and evaluated the patient this AM along with Mr. Kilroy.  After reviewing all the available data and chart,  I agree with his findings, examination as well as impression recommendations.  Pt with recent Normal Cath in Aug 2015 now presents  with NSTEMI (Trop >10) with short runs of non-sustained VT overnight.  Initial CP was 10/10 - never fully went away, but eased off to 2/10. Currently ~5/10. Will re-dose SL NTQ & increase NTG gtt rate.  Agree with plan for Cath/PCI today.  With recurrent CP, will ask to move her up on the list. * has had ASA * Agree with BB & statin   Was on Metformin @ home - on hold --> SSI NSVT - add BB, plan cath BP controlled - on home ARB, now with BB; will see true BP when off NTG gtt.   Procedure:  LEFT HEART CATHETERIZATION WITH CORONARY ANGIOGRAPHY & POSSIBLE PERCUTANEOUS CORONARY INTERVENTION  The procedure with Risks/Benefits/Alternatives and Indications was reviewed with the patient.  All questions were answered.    Risks / Complications include, but not limited to: Death, MI, CVA/TIA, VF/VT (with defibrillation), Bradycardia (need for temporary pacer placement), contrast induced nephropathy, bleeding / bruising / hematoma / pseudoaneurysm, vascular or coronary injury (with possible emergent CT or Vascular Surgery), adverse medication reactions, infection.  Additional risks involving the use of radiation with the possibility of radiation burns and cancer were explained in detail.  The patient voices understanding and agree to proceed.      Asier Desroches W, M.D., M.S. Interventional Cardiologist   Pager # [phone] Phone # [phone] [address] [place], [place] [address]    

## 2016-02-29 NOTE — Interval H&P Note (Signed)
Cath Lab Visit (complete for each Cath Lab visit)  Clinical Evaluation Leading to the Procedure:   ACS: Yes.    Non-ACS:    Anginal Classification: CCS IV  Anti-ischemic medical therapy: Maximal Therapy (2 or more classes of medications)  Non-Invasive Test Results: No non-invasive testing performed  Prior CABG: No previous CABG      History and Physical Interval Note:  03/23/2016 10:17 AM  Kara Montoya  has presented today for surgery, with the diagnosis of cp  The various methods of treatment have been discussed with the patient and family. After consideration of risks, benefits and other options for treatment, the patient has consented to  Procedure(s): Left Heart Cath and Coronary Angiography (N/A) as a surgical intervention .  [name] history has been reviewed, patient examined, no change in status, stable for surgery.  I have reviewed the patient's chart and labs.  Questions were answered to the patient's satisfaction.     Lesleigh NoeSMITH III,Krystall Kruckenberg W

## 2016-02-29 NOTE — H&P (Signed)
Transfer from [place]PI: 75 y/o woman with with a history of HTN and DM, presented to the [facility] Department complaining of constant 10/10 pain across her chest since ~ 1900 today. Pt reports associated persistent cough x 1 week, and mild nasal congestion. She denies sore throat, nausea, vomiting, diaphoresis and SOB. Pt was evaluated in the ED for cough on 02/24/16 and diagnosed with bronchitis. No alleviating factors noted. She denies use of blood thinners. Pt is a smoker. Ed MD spoke with Dr. Tresa EndoKelly and discussed with EKGs. It was decided to bring the [facility]. There were no SDU beds at that time and [facility]. She was started on heparin drip and transferred to left heart cath in am.  She has prior cath in Aug of 2016 that showed normal coronary arteries. Pain started at 1900 last night and she was walking in her home without any exacerbation. Currently cp controlled.    Review of Systems:     Cardiac Review of Systems: {Y] = yes [ ]  = no  Chest Pain [ y   ]  Resting SOB [   ] Exertional SOB  [  ]  Orthopnea [  ]   Pedal Edema [   ]    Palpitations [  ] Syncope  [  ]   Presyncope [   ]  General Review of Systems: [Y] = yes [  ]=no Constitional: recent weight change [  ]; anorexia [  ]; fatigue [  ]; nausea [  ]; night sweats [  ]; fever [  ]; or chills [  ];                                                                     Dental: poor dentition[  ];   Eye : blurred vision [  ]; diplopia [   ]; vision changes [  ];  Amaurosis fugax[  ]; Resp: cough [  ];  wheezing[  ];  hemoptysis[  ]; shortness of breath[  ]; paroxysmal nocturnal dyspnea[  ]; dyspnea on exertion[  ]; or orthopnea[  ];  GI:  gallstones[  ], vomiting[  ];  dysphagia[  ]; melena[  ];  hematochezia [  ]; heartburn[  ];   GU: kidney stones [  ]; hematuria[  ];   dysuria [  ];  nocturia[  ];               Skin: rash [  ], swelling[  ];, hair loss[  ];  peripheral edema[  ];  or  itching[  ]; Musculosketetal: myalgias[  ];  joint swelling[  ];  joint erythema[  ];  joint pain[  ];  back pain[  ];  Heme/Lymph: bruising[  ];  bleeding[  ];  anemia[  ];  Neuro: TIA[  ];  headaches[  ];  stroke[  ];  vertigo[  ];  seizures[  ];   paresthesias[  ];  difficulty walking[  ];  Psych:depression[  ]; anxiety[  ];  Endocrine: diabetes[  ];  thyroid dysfunction[  ];  Other:  Past Medical History  Diagnosis Date  . Diabetes mellitus   . Hypertension   .  Arthritis     No current facility-administered medications on file prior to encounter.   Current Outpatient Prescriptions on File Prior to Encounter  Medication Sig Dispense Refill  . aspirin EC 325 MG tablet Take 1 tablet (325 mg total) by mouth 2 (two) times daily. 30 tablet 0  . losartan (COZAAR) 25 MG tablet Take 25 mg by mouth daily.      . metFORMIN (GLUCOPHAGE) 500 MG tablet Take 1 tablet (500 mg total) by mouth 2 (two) times daily with a meal. Resume on August 3    . Multiple Vitamins-Minerals-FA (VITATAB MV) 2.8 MG TABS Take 2 tablets by mouth daily.      Marland Kitchen omeprazole (PRILOSEC) 20 MG capsule 1 tablet twice daily for 2 weeks and then once daily 60 capsule 2  . oxyCODONE (OXY IR/ROXICODONE) 5 MG immediate release tablet Take 1-2 tablets (5-10 mg total) by mouth every 6 (six) hours as needed for severe pain. 40 tablet 0  . tiZANidine (ZANAFLEX) 2 MG tablet Take 1 tablet (2 mg total) by mouth every 8 (eight) hours as needed. (Patient taking differently: Take 2 mg by mouth every 8 (eight) hours as needed for muscle spasms. ) 60 tablet 0      Allergies  Allergen Reactions  . Adhesive [Tape] Other (See Comments)    Pt states it removes her skin  . Latex Itching  . Morphine And Related     Sweaty, suppresses breathing    Social History   Social History  . Marital Status: Widowed    Spouse Name: N/A  . Number of Children: N/A  . Years of Education: N/A   Occupational History  . Not on file.   Social  History Main Topics  . Smoking status: Current Every Day Smoker -- 0.25 packs/day for 30 years    Types: Cigarettes  . Smokeless tobacco: Never Used  . Alcohol Use: No  . Drug Use: No  . Sexual Activity: Not on file   Other Topics Concern  . Not on file   Social History Narrative    No family history on file.  PHYSICAL EXAM: Filed Vitals:   March 29, 2016 0216 03-29-16 0315  BP: 133/80 133/71  Pulse: 71 70  Temp:  98.7 F (37.1 C)  Resp: 16 16   General:  Well appearing. No respiratory difficulty HEENT: normal Neck: supple. no JVD. Carotids 2+ bilat; no bruits.  Cor: PMI nondisplaced. Regular rate & rhythm. No rubs, gallops or murmurs. Lungs: clear Abdomen: soft, nontender, nondistended. No hepatosplenomegaly. No bruits or masses. Good bowel sounds. Extremities: no cyanosis, clubbing, rash, edema Neuro: alert & oriented x 3, cranial nerves grossly intact. moves all 4 extremities w/o difficulty. Affect pleasant.  ECG:  Results for orders placed or performed during the hospital encounter of 02/25/2016 (from the past 24 hour(s))  Basic metabolic panel     Status: Abnormal   Collection Time: 03/16/2016  8:50 PM  Result Value Ref Range   Sodium 143 135 - 145 mmol/L   Potassium 4.3 3.5 - 5.1 mmol/L   Chloride 108 101 - 111 mmol/L   CO2 24 22 - 32 mmol/L   Glucose, Bld 171 (H) 65 - 99 mg/dL   BUN 19 6 - 20 mg/dL   Creatinine, Ser 1.61 0.44 - 1.00 mg/dL   Calcium 8.8 (L) 8.9 - 10.3 mg/dL   GFR calc non Af Amer >60 >60 mL/min   GFR calc Af Amer >60 >60 mL/min   Anion gap 11 5 -  15  CBC     Status: None   Collection Time: 03/16/2016  8:50 PM  Result Value Ref Range   WBC 5.9 4.0 - 10.5 K/uL   RBC 4.24 3.87 - 5.11 MIL/uL   Hemoglobin 12.8 12.0 - 15.0 g/dL   HCT 16.1 09.6 - 04.5 %   MCV 95.3 78.0 - 100.0 fL   MCH 30.2 26.0 - 34.0 pg   MCHC 31.7 30.0 - 36.0 g/dL   RDW 40.9 81.1 - 91.4 %   Platelets 180 150 - 400 K/uL  Troponin I     Status: Abnormal   Collection Time: 02/29/2016   8:50 PM  Result Value Ref Range   Troponin I 0.24 (H) <0.031 ng/mL  Heparin level     Status: Abnormal   Collection Time: 03/19/2016 10:26 PM  Result Value Ref Range   Heparin Unfractionated <0.10 (L) 0.30 - 0.70 IU/mL   Dg Chest 2 View  03/09/2016  CLINICAL DATA:  Chest pain today. Cough, congestion and scratchy throat for 1 week. EXAM: CHEST  2 VIEW COMPARISON:  Radiographs 3 days prior 02/25/2016 FINDINGS: The cardiomediastinal contours are normal. Emphysematous change again seen. Superimposed bronchial thickening, new from prior. Pulmonary vasculature is normal. No consolidation, pleural effusion, or pneumothorax. No acute osseous abnormalities are seen. IMPRESSION: New bronchial thickening consistent with acute bronchitis. Electronically Signed   By: Rubye Oaks M.D.   On: 03/23/2016 21:37     ASSESSMENT: 1. NSTEMI, EKG with dynamic changes 2. HTN dM  PLAN/DISCUSSION: 1. ASA, heparin drip, trops 2. Urgent cath in am for coronary anatomy  3. Continue home meds  Sulaiman Alonna Minium

## 2016-02-29 NOTE — Progress Notes (Signed)
ANTICOAGULATION CONSULT NOTE  Pharmacy Consult for Heparin  Indication: chest pain/ACS  Allergies  Allergen Reactions  . Adhesive [Tape] Other (See Comments)    Pt states it removes her skin  . Latex Itching  . Morphine And Related     Sweaty, suppresses breathing    Patient Measurements: Height: 5\' 3"  (160 cm) Weight: 211 lb 13.8 oz (96.1 kg) IBW/kg (Calculated) : 52.4  Vital Signs: Temp: 98.2 F (36.8 C) (03/08 0801) Temp Source: Oral (03/08 0801) BP: 116/75 mmHg (03/08 1555) Pulse Rate: 68 (03/08 1555)  Labs:  Recent Labs  03/06/2016 2050 02/23/2016 2226 02/22/2016 0454 03/04/2016 0857  HGB 12.8  --  11.3*  --   HCT 40.4  --  36.8  --   PLT 180  --  169  --   LABPROT  --   --   --  14.3  INR  --   --   --  1.09  HEPARINUNFRC  --  <0.10*  --  0.37  CREATININE 0.79  --  0.77  --   TROPONINI 0.24*  --  12.04* 23.16*    Estimated Creatinine Clearance: 67 mL/min (by C-G formula based on Cr of 0.77).   Medical History: Past Medical History  Diagnosis Date  . Diabetes mellitus   . Hypertension   . Arthritis       Assessment: 74 yof admitted 03/19/2016  with CP. Pharmacy consulted to restart heparin 8h after sheath removal. Sheath was removed at approximately 1050.  Goal of Therapy:  Heparin level 0.3-0.7 units/ml Monitor platelets by anticoagulation protocol: Yes   Plan:  Heparin 1000 units/hr 8hr HL Daily HL/CBC  Monitor for s/s of bleeding   Arlean Hoppingorey M. Newman PiesBall, PharmD, BCPS Clinical Pharmacist Pager [phone] 03/04/2016 4:25 PM

## 2016-02-29 NOTE — ED Notes (Signed)
Cardiology paged for further pain management. Pt reports fentanyl helped previously for worsening pain

## 2016-02-29 NOTE — H&P (View-Only) (Signed)
Subjective:  No chest pain this am.  Objective:  Vital Signs in the last 24 hours: Temp:  [98.2 F (36.8 C)-98.8 F (37.1 C)] 98.2 F (36.8 C) (03/08 0801) Pulse Rate:  [62-93] 93 (03/08 0801) Resp:  [10-19] 17 (03/08 0801) BP: (113-161)/(60-87) 113/60 mmHg (03/08 0801) SpO2:  [94 %-100 %] 95 % (03/08 0809) Weight:  [211 lb 13.8 oz (96.1 kg)-212 lb (96.163 kg)] 211 lb 13.8 oz (96.1 kg) (03/08 0315)  Intake/Output from previous day:  Intake/Output Summary (Last 24 hours) at 24-Mar-2016 0849 Last data filed at 03-24-2016 0600  Gross per 24 hour  Intake 171.76 ml  Output    300 ml  Net -128.24 ml    Physical Exam: General appearance: alert, cooperative, no distress and mildly obese Lungs: clear to auscultation bilaterally Heart: regular rate and rhythm Extremities: no edema, no calf tenderness Skin: Skin color, texture, turgor normal. No rashes or lesions Neurologic: Grossly normal   ROS: She denies hemoptysis, calf pain, or history of blood clots. No recent long trips.    Rate: 66  Rhythm: normal sinus rhythm-6 beats NSVT  Lab Results:  Recent Labs  03/12/2016 2050 03-24-16 0454  WBC 5.9 5.3  HGB 12.8 11.3*  PLT 180 169    Recent Labs  02/27/2016 2050 March 24, 2016 0454  NA 143 145  K 4.3 4.0  CL 108 111  CO2 24 23  GLUCOSE 171* 160*  BUN 19 13  CREATININE 0.79 0.77    Recent Labs  03/03/2016 2050 2016-03-24 0454  TROPONINI 0.24* 12.04*   No results for input(s): INR in the last 72 hours.  Scheduled Meds: . [START ON 03/10/2016] aspirin  81 mg Oral Daily  . atorvastatin  80 mg Oral q1800  . heparin  4,000 Units Intravenous Once  . insulin aspart  0-5 Units Subcutaneous QHS  . insulin aspart  0-9 Units Subcutaneous TID WC  . ipratropium-albuterol  3 mL Nebulization BID  . losartan  25 mg Oral Daily  . metoprolol tartrate  12.5 mg Oral BID  . pantoprazole  40 mg Oral Daily  . sodium chloride flush  3 mL Intravenous Q12H   Continuous Infusions: .  heparin 1,000 Units/hr (24-Mar-2016 0600)  . nitroGLYCERIN 50 mcg/min (03/24/2016 0600)   PRN Meds:.acetaminophen, albuterol, ALPRAZolam, ondansetron (ZOFRAN) IV, oxyCODONE, zolpidem   Imaging: Dg Chest 2 View  03/07/2016  CLINICAL DATA:  Chest pain today. Cough, congestion and scratchy throat for 1 week. EXAM: CHEST  2 VIEW COMPARISON:  Radiographs 3 days prior 02/25/2016 FINDINGS: The cardiomediastinal contours are normal. Emphysematous change again seen. Superimposed bronchial thickening, new from prior. Pulmonary vasculature is normal. No consolidation, pleural effusion, or pneumothorax. No acute osseous abnormalities are seen. IMPRESSION: New bronchial thickening consistent with acute bronchitis. Electronically Signed   By: Rubye Oaks M.D.   On: 03/10/2016 21:37    Cardiac Studies: Cath Aug 2016 Normal coronaries, normal LVF  Assessment/Plan:  75 y/o AA woman with with a history of HTN and DM, seen by Dr Tenny Craw in Aug 2016 when she was admitted with [place] (Troponin negative but chest pain and abnormal EKG). Cath done 07/25/15 revealed normal coronaries and normal LVF. She was treated as GI pain. She is admitted now with a NSTEMI-Troponin 12. She had a URI "bronchitis over the past week. She developed 10/10 chest pain and presented to ED in [place]- transferred here for further evaluation.   Principal Problem:   NSTEMI (non-ST elevated myocardial infarction) [facility]) Active Problems:  Unstable angina (HCC)   NSVT    Diabetes mellitus type 2 in obese [facility](HCC)   Essential hypertension  PLAN: Stable currently on Heparin and NTG. Cath today. Add low dose beta blocker. Cath orders written.   Corine ShelterLuke Kilroy PA-C 03/04/2016, 8:49 AM [phone]  I have seen, examined and evaluated the patient this AM along with Mr. Diona FantiKilroy.  After reviewing all the available data and chart,  I agree with his findings, examination as well as impression recommendations.  Pt with recent Normal Cath in Aug 2015 now presents  with NSTEMI (Trop >10) with short runs of non-sustained VT overnight.  Initial CP was 10/10 - never fully went away, but eased off to 2/10. Currently ~5/10. Will re-dose SL NTQ & increase NTG gtt rate.  Agree with plan for Cath/PCI today.  With recurrent CP, will ask to move her up on the list. * has had ASA * Agree with BB & statin   Was on Metformin @ home - on hold --> SSI NSVT - add BB, plan cath BP controlled - on home ARB, now with BB; will see true BP when off NTG gtt.   Procedure:  LEFT HEART CATHETERIZATION WITH CORONARY ANGIOGRAPHY & POSSIBLE PERCUTANEOUS CORONARY INTERVENTION  The procedure with Risks/Benefits/Alternatives and Indications was reviewed with the patient.  All questions were answered.    Risks / Complications include, but not limited to: Death, MI, CVA/TIA, VF/VT (with defibrillation), Bradycardia (need for temporary pacer placement), contrast induced nephropathy, bleeding / bruising / hematoma / pseudoaneurysm, vascular or coronary injury (with possible emergent CT or Vascular Surgery), adverse medication reactions, infection.  Additional risks involving the use of radiation with the possibility of radiation burns and cancer were explained in detail.  The patient voices understanding and agree to proceed.      Marykay LexHARDING, Kaliope Quinonez W, M.D., M.S. Interventional Cardiologist   Pager # [phone] Phone # [phone] [address] [place], [place] [address]

## 2016-02-29 NOTE — Progress Notes (Signed)
Family in to visit.

## 2016-02-29 NOTE — ED Notes (Signed)
Pt c/o worsening central and L sided chest pain radiating into back; nitro gtt titrated without relief

## 2016-02-29 NOTE — Progress Notes (Signed)
Ambulated to BR to void. Returned to Doctor, general practicestretcher. Tolerated well. Visitor in.

## 2016-03-01 ENCOUNTER — Encounter (HOSPITAL_COMMUNITY): Payer: Self-pay | Admitting: Interventional Cardiology

## 2016-03-01 ENCOUNTER — Inpatient Hospital Stay (HOSPITAL_COMMUNITY): Payer: Medicare Other

## 2016-03-01 ENCOUNTER — Encounter (HOSPITAL_COMMUNITY): Admission: EM | Disposition: E | Payer: Self-pay | Source: Home / Self Care | Attending: Cardiology

## 2016-03-01 DIAGNOSIS — I469 Cardiac arrest, cause unspecified: Secondary | ICD-10-CM

## 2016-03-01 LAB — HEMOGLOBIN A1C
HEMOGLOBIN A1C: 6 % — AB (ref 4.8–5.6)
Mean Plasma Glucose: 126 mg/dL

## 2016-03-01 LAB — GLUCOSE, CAPILLARY
Glucose-Capillary: 140 mg/dL — ABNORMAL HIGH (ref 65–99)
Glucose-Capillary: 144 mg/dL — ABNORMAL HIGH (ref 65–99)

## 2016-03-01 LAB — CBC
HCT: 38.5 % (ref 36.0–46.0)
Hemoglobin: 12.4 g/dL (ref 12.0–15.0)
MCH: 30.2 pg (ref 26.0–34.0)
MCHC: 32.2 g/dL (ref 30.0–36.0)
MCV: 93.9 fL (ref 78.0–100.0)
Platelets: 210 10*3/uL (ref 150–400)
RBC: 4.1 MIL/uL (ref 3.87–5.11)
RDW: 14.9 % (ref 11.5–15.5)
WBC: 10.6 10*3/uL — ABNORMAL HIGH (ref 4.0–10.5)

## 2016-03-01 LAB — ECHOCARDIOGRAM LIMITED
HEIGHTINCHES: 63 in
Weight: 3389.79 oz

## 2016-03-01 LAB — HEPARIN LEVEL (UNFRACTIONATED): Heparin Unfractionated: <0.1 IU/mL — ABNORMAL LOW (ref 0.30–0.70)

## 2016-03-01 SURGERY — LEFT HEART CATH AND CORONARY ANGIOGRAPHY

## 2016-03-01 MED ORDER — EPINEPHRINE HCL 1 MG/ML IJ SOLN
0.5000 ug/min | INTRAVENOUS | Status: DC
  Filled 2016-03-01 (×2): qty 4

## 2016-03-01 MED FILL — Medication: Qty: 1 | Status: AC

## 2016-03-02 ENCOUNTER — Encounter (HOSPITAL_COMMUNITY): Payer: Self-pay | Admitting: Interventional Cardiology

## 2016-03-05 ENCOUNTER — Telehealth: Payer: Self-pay | Admitting: Cardiology

## 2016-03-05 NOTE — Telephone Encounter (Signed)
03/05/2016 Re[facility] death certificate [facility]  Who walk in to  Drop off death certificate on patient to be filled out Dr. Herbie BaltimoreHarding.  I will give to Dr. Herbie BaltimoreHarding nurse in the morning. cbr

## 2016-03-09 NOTE — Telephone Encounter (Signed)
Follow up     Pt was seen in [place] [facility] for [facility], transported [facility] and died a few days later.  Patient's brother lives out of [place] want to talk to the doctor about his sister.  The family is trying to get a better understanding on what was wrong with their loved one and to see if this is a part of their family history.  Brother says he talked to Dr Herbie BaltimoreHarding once while patient was in the hospital.

## 2016-03-09 NOTE — Telephone Encounter (Signed)
Inquiry sent to Dr. Herbie BaltimoreHarding.

## 2016-03-10 NOTE — Telephone Encounter (Signed)
I will be out of town for the upcoming week. Please convey my deepest sympathy.  I will be happy to try to talk when I return. In the meantime:  She suffered a heart attack with a totally closed branch of one her heart arteries.  Blockages in this location often do not show up on the EKG.  As a result, it was not identified until the following morning.  Because she was no longer having angina pain when she went to the Cardiac Cath Lab, the doctor felt that he should not try to open the artery with a balloon & stent (the longer the artery is blocked, it gets more full of clot & harder to open & the territory in jeopardy is less likely to recover -- can be more dangerous trying to open it up).  Unfortunately, she suffered one of the feared consequences of a "completed" MI -- we always worry about life-threatening hear rhythms & mechanical complications.  To the best I can tell, I was not there until well into the resuscitation event, her complication was "electrical" - cardiac arrest.  Despite > 30 minute of CPR, we were never able to get back a pulse.  As for the ramifications of her family, she was 75 years old with some cardiac risk factors (diabetes & HTN).  We tend to think of Heart Attacks at this age to be more related to Nurture & the patient's underlying risk and not Ashby Dawesature (genetic - beyond risks for Diabetes).    Family members should be more conscious of their own individual risk factors such as Diabetes, smoking, Hypertension, High Cholesterol, Obesity & sedentary lifestyle.     Marykay LexHARDING, DAVID W, M.D., M.S. Interventional Cardiologist   Pager # [phone] Phone # [phone] [address] [place], [place] [address]

## 2016-03-14 ENCOUNTER — Telehealth: Payer: Self-pay | Admitting: Cardiology

## 2016-03-14 NOTE — Telephone Encounter (Signed)
Pt's brother is calling in to speak with Dr. Herbie BaltimoreHarding about his sister's condition. Please f/u with him  Thanks

## 2016-03-14 NOTE — Telephone Encounter (Signed)
Returned call to Kara SierrasEd Lindsay, brother of this patient. I communicated our sympathy for his loss. We discussed concerns related to the patient's hospitalization, care, tests performed, etc to my capacity of knowledge.   He was upset during discussion - in part because I was not able to answer all questions thoroughly. He did have some specific questions related to the timeline of events during hospitalization and prior to the resuscitation event. He wanted to know, beyond the catheterization, what options were considered & what risks were considered w/ intervention vs non-intervention.  Caller is in [place] for another week or so. [place]'d [place] from [place] and is staying in town to help w/ family arrangements. He conveyed an urgent wish to meet with both Dr. Herbie BaltimoreHarding and Dr. Katrinka BlazingSmith before leaving town. He wishes to be able to have an in-person meeting.  I conveyed my understanding and intent that his questions and concerns are adequately and appropriately addressed. Message routed to Dr. Herbie BaltimoreHarding and Dr. Katrinka BlazingSmith. I will follow up w/ caller w/ advice/recommendations.

## 2016-03-15 NOTE — Telephone Encounter (Signed)
Spoke to the patient's brother Mr. Mardella LaymanLindsey.

## 2016-03-16 NOTE — Telephone Encounter (Signed)
Dr Katrinka BlazingSmith spoke with family

## 2016-03-24 NOTE — Progress Notes (Signed)
Echocardiogram 2D Echocardiogram has been performed.  Dorothey BasemanReel, Emmani Lesueur M 03/17/2016, 10:30 AM

## 2016-03-24 NOTE — Progress Notes (Signed)
Code documentation   Called to room for cardiac arrest The patient was initially in PEA. Responded to 1 amp of Epinephrine and <1 minute of CPR.  EKG showed ST elevation. She was initially coughing and purposeful w/ BP in 100s. She slowly became less responsive followed by agonal respiration and loss of pulse. We then initiated ACLS protocol.  Cardiology was called as she had ST elevation and we felt that this was primarily an STEMI. She had 7 rounds of epinephrine (7); 2 rounds of bicarb and we empirically treated for hyperkalemia. During CPR she was intubated w/ 7.5 airway w/out difficulty. Unfortunately after >40 minutes of CPR we were unable to re-establish ROSC.  Time of death recorded at 1012. Cardiology spoke to family.   Impression STEMI Cardiac arrest  Kara MartinetPeter E Cecylia Montoya ACNP-BC [facility] Pulmonary/Critical Care Pager # [phone] OR # [phone] if no answer

## 2016-03-24 NOTE — Progress Notes (Signed)
0930 - Pt unresponsive, agonal respiratons.  --EKG ?PEA. See code sheet. 1012 -pt expired. Family at bedside. Chaplain present. Emotional support given. Belongings with family. Dr. Ellyn Hack met with family.

## 2016-03-24 NOTE — Progress Notes (Signed)
   BRIEF CODE NOTE  Prior to my seeing the patient this AM, @ roughly 9:29 AM, she was apparently eating breakfast, then began having Siezure like activity.  Her daughter called for help -- no VT on tele per RN -- was noted to be in PEA arrest.  CPR initiated -- PCCM arrived & she was intubated.  6+ rounds of Epinephrine, 1 Amp Atropine, 40 Units Vasopressin, 2 Amp Bicarb, 1 Amp Calcium Gluconate administered.  Epinephrine gtt then Levophed gtt initiated. R FV Central Line placed & Intubated by PCCM  Pulseless VT/VF & intermittent PEA noted.  Never regained a pulse. --EKG showed extensive Inferior & Lateral STE (Dr. Tresa EndoKelly was notified & was on the scene when I arrived)  After ~30-35 min of CPR, still unable to feel a pulse with PEA -  Bedside Echo with no cardiac motion noted.   CPR was discontinued & Time of Death: 10:12 AM.    Drs. Bensimhon was also present with PCCM Dr.  Isaiah SergeMannam & Zenia ResidesPeter Babcock, PA.  We all agreed that no further options were available.  I personally spoke with the family (2 daughters in person & her brother on speaker phone).  Chaplain was present.   Marykay LexHARDING, Pedram Goodchild W, M.D., M.S. Interventional Cardiologist   Pager # [phone] Phone # [phone] [address] [place], [place] [address]

## 2016-03-24 NOTE — Discharge Summary (Addendum)
Expiration Note  Kara Montoya MRN: [id number] DOB: February 13, 1941  Admit Date: 29-Feb-2016 Time & Date of Death:  2016/03/02, 10:12 AM  Attending Physician: Kara Lex, MD PCP: Kara Martin, MD Consults: Dr. Caroline More, PA -- PCCM  Cause of Death: PEA Arrest, STEMI  Secondary Diagnosis:  Principal Problem:   NSTEMI (non-ST elevated myocardial infarction) [facility]) Active Problems:   Unstable angina (HCC)   NSVT    Diabetes mellitus type 2 in obese [facility])   Essential hypertension   Procedures:   Cardiac catheterization 03/15/2016:  Ostial OM 2 100%, ostial OM1 95%, mid circumflex 50%, ostial D1 %. Mild to moderate LV dysfunction. - EF 45% with inferior wall motion abnormality --> felt to be an occluded vessel greater than 12 hours post onset of symptoms.  As the patient was chest pain-free in the Cath Lab, the decision was made not to intervene/not reperfuse. Plan was medical management with Plavix nitroglycerin glycerin overnight and risk factor modification.    Echocardiogram: Performed during cardiac arrest. - No cardiac function  Right common femoral vein central line placement during CPR  Intubation during CPR  Hostpital Course: The patient was admitted with persistent substernal chest pain. She had gone to [facility] which was found to have some subtle ST segment changes, not consistent with STEMI. Her troponin levels were elevated and she was transferred to [facility]. There was no comment about any symptoms overnight, therefore she was seen early in the morning after admission to determine if she would need a catheterization.   At the time of initial evaluation she was found to be having 5/10 recurrent chest pain.  There has also been at least one episode of 5-7 beats of nonsustained V. tach overnight.. Therefore she was moved to the front of the Cath Lab list regionally cardiac catheterization showing occluded OM branch.  However, upon  arrival to the Cath Lab, she was no longer having chest pain. Therefore the decision was made not to intervene on this vessel as she was greater than 15 hours from onset of symptoms. The plan was to proceed with medical management.  She was placed on nitroglycerin drip, aspirin and Plavix as well as other cardiac medications. She was apparently doing fairly well on the nitroglycerin is being weaned off this morning (02-Mar-2016).  Reviewing the telemetry this morning revealed subtle ST elevations with frequent ectopy mostly PVCs. She was doing relatively well however with no symptoms while eating breakfast. Never during her breakfast the daughter noted that she started having seizure-like activities. Reviewing the telemetry at this time did not reveal any arrhythmias, however she was found to be pulseless presumed to be PEA arrest. CPR was initiated immediately.   The patient was resuscitated for close to 40 minutes with several rounds of medications administered. EKGs done during this gestation indicated inferior and lateral ST elevations. Code STEMI was called however she was not taking the cardiac catheterization lab as we were unable to regain a pulse. As there was concern of possible mechanical, location of MI a bedside echocardiogram was performed that did not show any evidence of wall rupture or pericardial effusion. But did show was that there was essentially cardiac standstill. At that time the resuscitation effort was discontinued.  Following resuscitation I spoke with the patient's 2 daughters personally and on over the telephone with her brother explaining the resuscitation efforts and the fact that we were unsuccessful. They seem to understand, however were obviously surprised at  this rapid turn of events.  Likely cause of death is probably MI related PEA arrest.   Between resuscitation efforts with critical care medicine, and discussions with the family, I spent close to an hour with patient  management and discussion. 50% of which was spent explaining the activities and advanced to the patient's family.   Kara LexHARDING, [name], M.D., M.S. [facility] [address] [place], [place]  [address]  [phone]  02/23/2016 11:44 PM

## 2016-03-24 NOTE — Procedures (Addendum)
Central Venous Catheter Insertion Procedure Note Kara BooksRuth L Montoya [id number] 09/08/1941  Procedure: Insertion of Central Venous Catheter Indications: Assessment of intravascular volume and Drug and/or fluid administration in setting of cardiac arrest  Procedure Details Consent: Unable to obtain consent because of emergent medical necessity. Time Out: Verified patient identification, verified procedure, site/side was marked, verified correct patient position, special equipment/implants available, medications/allergies/relevent history reviewed, required imaging and test results available.  Performed  Maximum sterile technique was used including antiseptics, cap, gloves, gown, hand hygiene, mask and sheet. Skin prep: Chlorhexidine; local anesthetic administered A antimicrobial bonded/coated triple lumen catheter was placed in the right femoral vein due to emergent situation using the Seldinger technique.  Evaluation Blood flow good Complications: No apparent complications   Arvilla MeresBensimhon, Daniel MD 02/29/2016, 10:24 AM

## 2016-03-24 NOTE — Procedures (Signed)
Intubation Procedure Note Kara Montoya [id number] 05/10/1941  Procedure: Intubation Indications: cardiac arrest  Procedure Details Consent: Unable to obtain consent because of emergent medical necessity. Time Out: Verified patient identification, verified procedure, site/side was marked, verified correct patient position, special equipment/implants available, medications/allergies/relevent history reviewed, required imaging and test results available.  Performed  Maximum sterile technique was used including gloves, hand hygiene and mask.  MAC and 3    Evaluation Hemodynamic Status: Persistent hypotension treated with pressors, fluid and CPR; O2 sats: not recordable  Patient's Current Condition: deceased  Complications: Complications of no complications from ETT but pt did not return to ROSC Patient did tolerate procedure well. Chest X-ray ordered to verify placement.  CXR: tube position acceptable.   Shelby Mattocksete E Trevon Strothers 03/06/2016

## 2016-03-24 NOTE — Progress Notes (Signed)
ANTICOAGULATION CONSULT NOTE  Pharmacy Consult for Heparin  Indication: chest pain/ACS  Allergies  Allergen Reactions  . Adhesive [Tape] Other (See Comments)    Pt states it removes her skin  . Latex Itching  . Morphine And Related     Sweaty, suppresses breathing    Patient Measurements: Height: 5\' 3"  (160 cm) Weight: 211 lb 13.8 oz (96.1 kg) IBW/kg (Calculated) : 52.4  Heparin dosing wt: 75 kg  Vital Signs: Temp: 100 F (37.8 C) (03/08 2305) Temp Source: Oral (03/08 2305) BP: 126/71 mmHg (03/09 0200) Pulse Rate: 74 (03/09 0300)  Labs:  Recent Labs  02/26/2016 2050 03/09/2016 2226 03/20/2016 0454 03/16/2016 0857 03/23/2016 1809 2016-02-21 0236  HGB 12.8  --  11.3*  --   --  12.4  HCT 40.4  --  36.8  --   --  38.5  PLT 180  --  169  --   --  210  LABPROT  --   --   --  14.3  --   --   INR  --   --   --  1.09  --   --   HEPARINUNFRC  --  <0.10*  --  0.37  --  <0.10*  CREATININE 0.79  --  0.77  --   --   --   TROPONINI 0.24*  --  12.04* 23.16* 30.05*  --     Estimated Creatinine Clearance: 67 mL/min (by C-G formula based on Cr of 0.77).  Assessment: 74 yof on heparin for NSTEMI. Restarted post cath. Heparin level undetectable on 1000 units/hr. No issues with line or bleeding reported per RN. CBC stable.  Goal of Therapy:  Heparin level 0.3-0.7 units/ml Monitor platelets by anticoagulation protocol: Yes   Plan:  Increase heparin 1250 units/hr F/u 8hr HL  Christoper Fabianaron Soo Steelman, PharmD, BCPS Clinical pharmacist, pager [phone] 02/23/2016 4:43 AM

## 2016-03-24 DEATH — deceased
